# Patient Record
Sex: Male | Born: 1990 | Race: White | Hispanic: No | Marital: Single | Smoking: Current every day smoker
Health system: Southern US, Community
[De-identification: ages and names within clinical notes are randomized; demographics above are authoritative.]

## PROBLEM LIST (undated history)

## (undated) DIAGNOSIS — F1120 Opioid dependence, uncomplicated: Secondary | ICD-10-CM

## (undated) HISTORY — PX: KNEE ARTHROSCOPY: SUR90

## (undated) HISTORY — PX: SHOULDER ARTHROSCOPY: SHX128

---
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
## 2011-01-10 NOTE — Op Note (Signed)
NAMESILUS, LANZO             ACCOUNT NO.:  [id number]   MEDICAL RECORD NO.:  [id number]          PATIENT TYPE:  AMB   LOCATION:  SDS                          FACILITY:  [facility]   PHYSICIAN:  Nadara Mustard, MD     DATE OF BIRTH:  1991/07/05   DATE OF PROCEDURE:  09/25/2005  DATE OF DISCHARGE:                                 OPERATIVE REPORT   PREOPERATIVE DIAGNOSIS:  Osteochondroma, right shoulder.   POSTOPERATIVE DIAGNOSIS:  Osteochondroma, right shoulder.   PROCEDURE:  Excision osteochondroma, right shoulder.   SURGEON.:  Nadara Mustard, M.D.   ANESTHESIA:  General.   ESTIMATED BLOOD LOSS:  100 mL.   ANTIBIOTICS:  1 gram Kefzol.   DRAINS:  None.   COMPLICATIONS:  None.   DISPOSITION:  To PACU in stable condition.   INDICATIONS FOR PROCEDURE:  The patient is a 20 year old gentleman with a  large posterior lateral osteochondroma of the proximal right humerus. The  patient has undergone an MRI scan which was consistent with a benign  osteochondroma. The patient's mother and father were discussed the risks of  chondrosarcoma versus continued observation. The patient and the parents  wished to proceed with surgical intervention at this time. Risks and  benefits were discussed including infection, neurovascular injury, injury to  the axillary nerve, potential for chondrosarcoma in the cartilage cap. The  patient's parents state they understand and wished to proceed at this time.   DESCRIPTION OF PROCEDURE:  The patient was brought to OR room 15 and  underwent general anesthetic. After adequate level of anesthesia was  obtained, the patient was placed in the beach-chair position and his right  upper extremity was prepped using DuraPrep and draped in a sterile field. An  anterior incision was made obliquely from the coracoid along the line of the  cephalic vein. The interval between the coracobrachialis and the deltoid was  developed. There was not a cephalic vein within  this interval. The  coracobrachialis was retracted medially and the deltoid was retracted  laterally. Subperiosteal dissection was then carried out. The axillary nerve  was protected and was visualized. The osteotome was then used to resect the  osteochondroma from the surface of the proximal humerus. The osteochondroma  was delivered and several components. The wound was irrigated with normal  saline. There was no further osteochondroma tissue. The axillary nerve was  visualized and intact. The subcu was closed  using 2-0 Vicryl and the skin was closed using running intracuticular 3-0  Monocryl Steri-Strips. Were applied plus Adaptic, 4x4s and Hypafix tape. The  patient was placed in a sling, extubated, taken to PACU in stable condition.  Plan for 23-hour observation. The osteochondroma was sent to path to  evaluate for possible chondrosarcoma.      Nadara Mustard, MD  Electronically Signed     MVD/MEDQ  D:  09/25/2005  T:  09/25/2005  Job:  [id number]

## 2011-10-28 ENCOUNTER — Encounter (HOSPITAL_BASED_OUTPATIENT_CLINIC_OR_DEPARTMENT_OTHER): Payer: Self-pay | Admitting: *Deleted

## 2011-10-28 ENCOUNTER — Emergency Department (HOSPITAL_BASED_OUTPATIENT_CLINIC_OR_DEPARTMENT_OTHER)
Admission: EM | Admit: 2011-10-28 | Discharge: 2011-10-28 | Disposition: A | Discharge status: Home / Self Care | Payer: Medicaid Other | Attending: Emergency Medicine | Admitting: Emergency Medicine

## 2011-10-28 DIAGNOSIS — F17200 Nicotine dependence, unspecified, uncomplicated: Secondary | ICD-10-CM | POA: Insufficient documentation

## 2011-10-28 DIAGNOSIS — J029 Acute pharyngitis, unspecified: Secondary | ICD-10-CM | POA: Insufficient documentation

## 2011-10-28 DIAGNOSIS — R509 Fever, unspecified: Secondary | ICD-10-CM | POA: Insufficient documentation

## 2011-10-28 DIAGNOSIS — R10819 Abdominal tenderness, unspecified site: Secondary | ICD-10-CM | POA: Insufficient documentation

## 2011-10-28 MED ORDER — DEXAMETHASONE SODIUM PHOSPHATE 10 MG/ML IJ SOLN
10.0000 mg | Freq: Once | INTRAMUSCULAR | Status: AC
  Administered 2011-10-28: 10 mg via INTRAMUSCULAR
  Filled 2011-10-28: qty 1

## 2011-10-28 MED ORDER — PENICILLIN G BENZATHINE 1200000 UNIT/2ML IM SUSP
1.2000 10*6.[IU] | Freq: Once | INTRAMUSCULAR | Status: AC
  Administered 2011-10-28: 1.2 10*6.[IU] via INTRAMUSCULAR
  Filled 2011-10-28: qty 2

## 2011-10-28 NOTE — ED Provider Notes (Signed)
History     CSN: [id number]  Arrival date & time 10/28/11  1356   First MD Initiated Contact with Patient 10/28/11 1417      Chief Complaint  Patient presents with  . Sore Throat    Pt. reports he feels like he has strep throat    (Consider location/radiation/quality/duration/timing/severity/associated sxs/prior treatment) Patient is a 21 y.o. male presenting with pharyngitis. The history is provided by the patient. No language interpreter was used.  Sore Throat This is a new problem. The current episode started yesterday. The problem occurs constantly. The problem has been unchanged. Associated symptoms include a fever and a sore throat. The symptoms are aggravated by swallowing. He has tried acetaminophen and NSAIDs for the symptoms. The treatment provided moderate relief.    No past medical history on file.  Past Surgical History  Procedure Date  . Knee arthroscopy   . Shoulder arthroscopy     No family history on file.  History  Substance Use Topics  . Smoking status: Current Some Day Smoker  . Smokeless tobacco: Not on file  . Alcohol Use: Yes      Review of Systems  Constitutional: Positive for fever.  HENT: Positive for sore throat.   All other systems reviewed and are negative.    Allergies  Review of patient's allergies indicates no known allergies.  Home Medications  No current outpatient prescriptions on file.  BP 120/63  Pulse 97  Temp 97.6 F (36.4 C)  Resp 18  Ht 6\' 2"  (1.88 m)  Wt 190 lb (86.183 kg)  BMI 24.39 kg/m2  SpO2 99%  Physical Exam  Nursing note and vitals reviewed. Constitutional: He is oriented to person, place, and time. He appears well-developed and well-nourished.  HENT:  Right Ear: External ear normal.  Left Ear: External ear normal.  Mouth/Throat: Oropharyngeal exudate and posterior oropharyngeal erythema present. No tonsillar abscesses.  Eyes: Conjunctivae and EOM are normal.  Neck: Neck supple.  Cardiovascular:  Normal rate and regular rhythm.   Pulmonary/Chest: Effort normal and breath sounds normal.  Abdominal: Soft. Bowel sounds are normal. There is tenderness.  Neurological: He is alert and oriented to person, place, and time.  Skin: Skin is warm and dry.    ED Course  Procedures (including critical care time)   Labs Reviewed  RAPID STREP SCREEN   No results found.   1. Pharyngitis       MDM  Pt treated here in the er based on history and physical exam        Teressa Lower, NP 10/28/11 1458

## 2011-10-28 NOTE — ED Notes (Signed)
Pt. Reports he has white spots on his troat.

## 2011-10-28 NOTE — Discharge Instructions (Signed)

## 2011-10-28 NOTE — ED Provider Notes (Signed)
Medical screening examination/treatment/procedure(s) were performed by non-physician practitioner and as supervising physician I was immediately available for consultation/collaboration.   Henny Strauch A. Patrica Duel, MD 10/28/11 1527

## 2012-08-24 ENCOUNTER — Encounter (HOSPITAL_COMMUNITY): Payer: Self-pay | Admitting: *Deleted

## 2012-08-24 ENCOUNTER — Emergency Department (HOSPITAL_COMMUNITY)
Admission: EM | Admit: 2012-08-24 | Discharge: 2012-08-24 | Disposition: A | Discharge status: Home / Self Care | Payer: Medicaid Other | Attending: Emergency Medicine | Admitting: Emergency Medicine

## 2012-08-24 DIAGNOSIS — R6883 Chills (without fever): Secondary | ICD-10-CM | POA: Insufficient documentation

## 2012-08-24 DIAGNOSIS — F17200 Nicotine dependence, unspecified, uncomplicated: Secondary | ICD-10-CM | POA: Insufficient documentation

## 2012-08-24 DIAGNOSIS — N12 Tubulo-interstitial nephritis, not specified as acute or chronic: Secondary | ICD-10-CM | POA: Insufficient documentation

## 2012-08-24 DIAGNOSIS — R059 Cough, unspecified: Secondary | ICD-10-CM | POA: Insufficient documentation

## 2012-08-24 DIAGNOSIS — J029 Acute pharyngitis, unspecified: Secondary | ICD-10-CM | POA: Insufficient documentation

## 2012-08-24 DIAGNOSIS — J3489 Other specified disorders of nose and nasal sinuses: Secondary | ICD-10-CM | POA: Insufficient documentation

## 2012-08-24 DIAGNOSIS — M545 Low back pain, unspecified: Secondary | ICD-10-CM | POA: Insufficient documentation

## 2012-08-24 DIAGNOSIS — R05 Cough: Secondary | ICD-10-CM | POA: Insufficient documentation

## 2012-08-24 LAB — URINALYSIS, ROUTINE W REFLEX MICROSCOPIC
Bilirubin Urine: NEGATIVE
Glucose, UA: NEGATIVE mg/dL
Hgb urine dipstick: NEGATIVE
Ketones, ur: 15 mg/dL — AB
Nitrite: NEGATIVE
Protein, ur: NEGATIVE mg/dL
Specific Gravity, Urine: 1.039 — ABNORMAL HIGH (ref 1.005–1.030)
Urobilinogen, UA: 1 mg/dL (ref 0.0–1.0)
pH: 6.5 (ref 5.0–8.0)

## 2012-08-24 LAB — COMPREHENSIVE METABOLIC PANEL WITH GFR
ALT: 13 U/L (ref 0–53)
AST: 20 U/L (ref 0–37)
Albumin: 4.2 g/dL (ref 3.5–5.2)
Alkaline Phosphatase: 50 U/L (ref 39–117)
Calcium: 9.6 mg/dL (ref 8.4–10.5)
Potassium: 3.7 meq/L (ref 3.5–5.1)
Sodium: 138 meq/L (ref 135–145)
Total Protein: 7.2 g/dL (ref 6.0–8.3)

## 2012-08-24 LAB — GLUCOSE, CAPILLARY

## 2012-08-24 LAB — LIPASE, BLOOD: Lipase: 13 U/L (ref 11–59)

## 2012-08-24 LAB — CBC WITH DIFFERENTIAL/PLATELET
Basophils Absolute: 0 K/uL (ref 0.0–0.1)
Basophils Relative: 0 % (ref 0–1)
Eosinophils Absolute: 0.3 K/uL (ref 0.0–0.7)
Eosinophils Relative: 2 % (ref 0–5)
HCT: 44.4 % (ref 39.0–52.0)
Hemoglobin: 15.4 g/dL (ref 13.0–17.0)
Lymphocytes Relative: 5 % — ABNORMAL LOW (ref 12–46)
Lymphs Abs: 0.6 K/uL — ABNORMAL LOW (ref 0.7–4.0)
MCH: 30.6 pg (ref 26.0–34.0)
MCHC: 34.7 g/dL (ref 30.0–36.0)
MCV: 88.3 fL (ref 78.0–100.0)
Monocytes Absolute: 0.8 10*3/uL (ref 0.1–1.0)
Monocytes Relative: 5 % (ref 3–12)
Neutro Abs: 12.6 10*3/uL — ABNORMAL HIGH (ref 1.7–7.7)
Neutrophils Relative %: 88 % — ABNORMAL HIGH (ref 43–77)
Platelets: 245 K/uL (ref 150–400)
RBC: 5.03 MIL/uL (ref 4.22–5.81)
RDW: 13.3 % (ref 11.5–15.5)
WBC: 14.3 10*3/uL — ABNORMAL HIGH (ref 4.0–10.5)

## 2012-08-24 LAB — COMPREHENSIVE METABOLIC PANEL
BUN: 17 mg/dL (ref 6–23)
CO2: 26 mEq/L (ref 19–32)
Chloride: 100 mEq/L (ref 96–112)
Creatinine, Ser: 0.99 mg/dL (ref 0.50–1.35)
GFR calc Af Amer: >90 mL/min (ref >90)
GFR calc non Af Amer: >90 mL/min (ref >90)
Glucose, Bld: 114 mg/dL — ABNORMAL HIGH (ref 70–99)
Total Bilirubin: 0.5 mg/dL (ref 0.3–1.2)

## 2012-08-24 LAB — URINE MICROSCOPIC-ADD ON

## 2012-08-24 MED ORDER — CIPROFLOXACIN HCL 500 MG PO TABS: 500.0000 mg | ORAL_TABLET | Freq: Two times a day (BID) | ORAL | Status: DC

## 2012-08-24 MED ORDER — ONDANSETRON HCL 4 MG PO TABS: 4.0000 mg | ORAL_TABLET | Freq: Four times a day (QID) | ORAL | Status: DC

## 2012-08-24 MED ORDER — OXYCODONE-ACETAMINOPHEN 5-325 MG PO TABS
2.0000 | ORAL_TABLET | Freq: Once | ORAL | Status: AC
  Administered 2012-08-24: 2 via ORAL
  Filled 2012-08-24: qty 2

## 2012-08-24 MED ORDER — OXYCODONE-ACETAMINOPHEN 5-325 MG PO TABS: ORAL_TABLET | ORAL | Status: DC

## 2012-08-24 MED ORDER — CIPROFLOXACIN HCL 500 MG PO TABS
500.0000 mg | ORAL_TABLET | Freq: Once | ORAL | Status: AC
  Administered 2012-08-24: 500 mg via ORAL
  Filled 2012-08-24: qty 1

## 2012-08-24 MED ORDER — ONDANSETRON 4 MG PO TBDP
4.0000 mg | ORAL_TABLET | Freq: Once | ORAL | Status: AC
  Administered 2012-08-24: 4 mg via ORAL
  Filled 2012-08-24: qty 1

## 2012-08-24 NOTE — ED Provider Notes (Signed)
History     CSN: [id number]  Arrival date & time 08/24/12  1424   First MD Initiated Contact with Patient 08/24/12 2002      Chief Complaint  Patient presents with  . Emesis    (Consider location/radiation/quality/duration/timing/severity/associated sxs/prior treatment) HPI  James Crane is a 21 y.o. male complaining of several episodes of nonbloody nonbilious emesis starting this afternoon and low back pain. Patient has positive sick contacts her children he is contact with our vomiting as well. Patient denies any abdominal pain, chest pain, diarrhea, dysuria, frequency, urethral discharge. Patient endorses chills, cough, runny nose, mild sore throat. He has not had a flu shot this year. Patient does not have regular STD testing. He is tolerating by mouth at this time.   History reviewed. No pertinent past medical history.  Past Surgical History  Procedure Date  . Knee arthroscopy   . Shoulder arthroscopy     No family history on file.  History  Substance Use Topics  . Smoking status: Current Some Day Smoker  . Smokeless tobacco: Not on file  . Alcohol Use: Yes      Review of Systems  Constitutional: Negative for fever.  Respiratory: Negative for shortness of breath.   Cardiovascular: Negative for chest pain.  Gastrointestinal: Positive for nausea and vomiting. Negative for abdominal pain and diarrhea.  Musculoskeletal: Positive for back pain.  All other systems reviewed and are negative.    Allergies  Other  Home Medications  No current outpatient prescriptions on file.  BP 111/70  Pulse 106  Temp 98.3 F (36.8 C) (Oral)  Resp 12  SpO2 100%  Physical Exam  Nursing note and vitals reviewed. Constitutional: He is oriented to person, place, and time. He appears well-developed and well-nourished. No distress.  HENT:  Head: Normocephalic.  Eyes: Conjunctivae normal and EOM are normal.  Cardiovascular: Normal rate, regular rhythm and intact distal  pulses.   Pulmonary/Chest: Effort normal and breath sounds normal. No stridor.  Abdominal: Soft. Bowel sounds are normal. He exhibits no distension and no mass. There is no tenderness. There is no rebound and no guarding.  Genitourinary:       Mild to Moderate CVA tenderness: right greater than left  Musculoskeletal: Normal range of motion.  Neurological: He is alert and oriented to person, place, and time.  Psychiatric: He has a normal mood and affect.    ED Course  Procedures (including critical care time)  Labs Reviewed  GLUCOSE, CAPILLARY - Abnormal; Notable for the following:    Glucose-Capillary 108 (*)     All other components within normal limits  CBC WITH DIFFERENTIAL - Abnormal; Notable for the following:    WBC 14.3 (*)     Neutrophils Relative 88 (*)     Neutro Abs 12.6 (*)     Lymphocytes Relative 5 (*)     Lymphs Abs 0.6 (*)     All other components within normal limits  COMPREHENSIVE METABOLIC PANEL - Abnormal; Notable for the following:    Glucose, Bld 114 (*)     All other components within normal limits  URINALYSIS, ROUTINE W REFLEX MICROSCOPIC - Abnormal; Notable for the following:    Specific Gravity, Urine 1.039 (*)     Ketones, ur 15 (*)     Leukocytes, UA SMALL (*)     All other components within normal limits  LIPASE, BLOOD  URINE MICROSCOPIC-ADD ON  GC/CHLAMYDIA PROBE AMP  URINE CULTURE   No results found.  1. Pyelonephritis       MDM  Patient with CVA tenderness, back pain, nausea vomiting. Urinalysis shows leukocytes 11-20 white blood cells. Blood work has leukocytosis of 14.3. I will treat him for a pyelonephritis with ciprofloxacin 500 twice a day for 7 days.  I'm also adding on gonorrhea and chlamydia to his urinalysis, addition to a culture. I asked the patient to follow with his primary care Dr. or return to the emergency room for any worsening of symptoms and also for a test of cure with a repeat urinalysis after the he finishes the  antibiotics.   Pt verbalized understanding and agrees with care plan. Outpatient follow-up and return precautions given.    New Prescriptions   CIPROFLOXACIN (CIPRO) 500 MG TABLET    Take 1 tablet (500 mg total) by mouth every 12 (twelve) hours.   ONDANSETRON (ZOFRAN) 4 MG TABLET    Take 1 tablet (4 mg total) by mouth every 6 (six) hours.   OXYCODONE-ACETAMINOPHEN (PERCOCET/ROXICET) 5-325 MG PER TABLET    1 to 2 tabs PO q6hrs  PRN for pain          Wynetta Emery, PA-C 08/24/12 2100

## 2012-08-24 NOTE — ED Notes (Signed)
Pt able to keep fluids down; pt denies nausea currently.

## 2012-08-24 NOTE — ED Notes (Signed)
Pt given d/c teaching and prescriptions. Pt instructed not to drive after being given pain medication. Pt ambulatory leaving ED. Pt has no further questions upon d/c teaching. Pt does not appear to be in any acute distress upon d/c.

## 2012-08-24 NOTE — ED Provider Notes (Signed)
Medical screening examination/treatment/procedure(s) were performed by non-physician practitioner and as supervising physician I was immediately available for consultation/collaboration.  Olivia Mackie, MD 08/24/12 [phone]

## 2012-08-24 NOTE — ED Notes (Signed)
Nicole Pisciotta, PA at bedside 

## 2012-08-24 NOTE — ED Notes (Signed)
Pt is here with vomiting since yesterday.  No diarrhea, but reports constipation.  No abdominal pain.  Pt reports lower back pain.  No pain with urination.  Pt appears pale.

## 2012-08-24 NOTE — ED Notes (Signed)
Pt updated on POC.  Pt very upset at the amount of time that he has been here.  Pt asleep on the couch in triage.

## 2012-08-24 NOTE — ED Notes (Signed)
The pt is lying  In the floor in the waiting room

## 2012-08-24 NOTE — ED Notes (Signed)
The pt advised fo the wait time. unhappy

## 2012-08-26 LAB — URINE CULTURE: Colony Count: 9000

## 2012-08-27 LAB — GC/CHLAMYDIA PROBE AMP: GC Probe RNA: NEGATIVE

## 2012-09-08 ENCOUNTER — Telehealth (HOSPITAL_COMMUNITY): Payer: Self-pay | Admitting: Emergency Medicine

## 2012-09-08 NOTE — ED Notes (Signed)
Patient notified of positive Chlamydia result after ID verified x three. RX Zithromax one gram PO x one dose, prescribed by Linwood Dibbles MD, called to [facility] [phone].

## 2012-09-18 ENCOUNTER — Emergency Department (HOSPITAL_COMMUNITY)
Admission: EM | Admit: 2012-09-18 | Discharge: 2012-09-18 | Disposition: A | Discharge status: Home / Self Care | Payer: Medicaid Other | Attending: Emergency Medicine | Admitting: Emergency Medicine

## 2012-09-18 ENCOUNTER — Encounter (HOSPITAL_COMMUNITY): Payer: Self-pay | Admitting: Emergency Medicine

## 2012-09-18 DIAGNOSIS — M7989 Other specified soft tissue disorders: Secondary | ICD-10-CM | POA: Insufficient documentation

## 2012-09-18 DIAGNOSIS — F17200 Nicotine dependence, unspecified, uncomplicated: Secondary | ICD-10-CM | POA: Insufficient documentation

## 2012-09-18 DIAGNOSIS — L03119 Cellulitis of unspecified part of limb: Secondary | ICD-10-CM | POA: Insufficient documentation

## 2012-09-18 DIAGNOSIS — L02519 Cutaneous abscess of unspecified hand: Secondary | ICD-10-CM | POA: Insufficient documentation

## 2012-09-18 DIAGNOSIS — L0390 Cellulitis, unspecified: Secondary | ICD-10-CM

## 2012-09-18 DIAGNOSIS — L299 Pruritus, unspecified: Secondary | ICD-10-CM | POA: Insufficient documentation

## 2012-09-18 MED ORDER — DIPHENHYDRAMINE HCL 50 MG/ML IJ SOLN
25.0000 mg | Freq: Once | INTRAMUSCULAR | Status: AC
  Administered 2012-09-18: 25 mg via INTRAMUSCULAR
  Filled 2012-09-18: qty 1

## 2012-09-18 MED ORDER — METHYLPREDNISOLONE SODIUM SUCC 125 MG IJ SOLR
125.0000 mg | Freq: Once | INTRAMUSCULAR | Status: AC
  Administered 2012-09-18: 125 mg via INTRAMUSCULAR
  Filled 2012-09-18: qty 2

## 2012-09-18 MED ORDER — SULFAMETHOXAZOLE-TMP DS 800-160 MG PO TABS
1.0000 | ORAL_TABLET | Freq: Once | ORAL | Status: AC
  Administered 2012-09-18: 1 via ORAL
  Filled 2012-09-18: qty 1

## 2012-09-18 MED ORDER — PREDNISONE 20 MG PO TABS: 40.0000 mg | ORAL_TABLET | Freq: Every day | ORAL | Status: DC

## 2012-09-18 MED ORDER — DIPHENHYDRAMINE HCL 50 MG/ML IJ SOLN: 25.0000 mg | Freq: Once | INTRAMUSCULAR | Status: DC

## 2012-09-18 MED ORDER — SULFAMETHOXAZOLE-TRIMETHOPRIM 800-160 MG PO TABS: 1.0000 | ORAL_TABLET | Freq: Two times a day (BID) | ORAL | Status: DC

## 2012-09-18 NOTE — ED Provider Notes (Signed)
History     CSN: [id number]  Arrival date & time 09/18/12  0100   First MD Initiated Contact with Patient 09/18/12 0131      Chief Complaint  Patient presents with  . Hand Problem    (Consider location/radiation/quality/duration/timing/severity/associated sxs/prior treatment) HPI  Pt to the ED with complaints of severe left hand itching, swelling and redness. The symptoms started a few days ago and have been progressively getting worse. He says that the reason he came is because it itches so badly he couldn't sleep. He has been using benadryl cream which helps for a short time but then the itching soon returns. He denies having any fevers, injury, bug bites to the hand. Admits that this has never happened before. Otherwise feels fine. No respiratory symptoms.  nad vss  History reviewed. No pertinent past medical history.  Past Surgical History  Procedure Date  . Knee arthroscopy   . Shoulder arthroscopy     History reviewed. No pertinent family history.  History  Substance Use Topics  . Smoking status: Current Some Day Smoker  . Smokeless tobacco: Not on file  . Alcohol Use: Yes      Review of Systems  Review of Systems  Gen: no weight loss, fevers, chills, night sweats  Eyes: no discharge or drainage, no occular pain or visual changes  Nose: no epistaxis or rhinorrhea  Mouth: no dental pain, no sore throat  Neck: no neck pain  Lungs:No wheezing, coughing or hemoptysis CV: no chest pain, palpitations, dependent edema or orthopnea  Abd: no abdominal pain, nausea, vomiting  GU: no dysuria or gross hematuria  MSK:  Swelling to left hand. No hand pain Neuro: no headache, no focal neurologic deficits  Skin: no abnormalities Psyche: negative.   Allergies  Other  Home Medications   Current Outpatient Rx  Name  Route  Sig  Dispense  Refill  . CIPROFLOXACIN HCL 500 MG PO TABS   Oral   Take 1 tablet (500 mg total) by mouth every 12 (twelve) hours.   20  tablet   0   . ONDANSETRON HCL 4 MG PO TABS   Oral   Take 1 tablet (4 mg total) by mouth every 6 (six) hours.   12 tablet   0   . OXYCODONE-ACETAMINOPHEN 5-325 MG PO TABS      1 to 2 tabs PO q6hrs  PRN for pain   15 tablet   0     BP 125/68  Pulse 75  Temp 97.9 F (36.6 C) (Oral)  Resp 16  SpO2 98%  Physical Exam  Nursing note and vitals reviewed. Constitutional: He appears well-developed and well-nourished. No distress.  HENT:  Head: Normocephalic and atraumatic.  Eyes: Pupils are equal, round, and reactive to light.  Neck: Normal range of motion. Neck supple.  Cardiovascular: Normal rate and regular rhythm.   Pulmonary/Chest: Effort normal.  Abdominal: Soft.  Musculoskeletal:       Posterior portion of Left hand is swollen, erythematous and indurated. No noticeable injuries, pain, decreased rang of motion. Pt vigorously scratching hand even during exam.  Neurological: He is alert.  Skin: Skin is warm and dry.    ED Course  Procedures (including critical care time)  Labs Reviewed - No data to display No results found.   No diagnosis found. Dx: Hand swelling   MDM  Pt otherwise healthy. Swelling, erythema and induration consistent with infection but he is having severe itchiness to the hand and no pain  or fevers with no site of infection. Will treat for both.   Given IM benadryl and Solu-medrol in ED as well as 1 PO Bactrim.  Rx:  1. Bactrim 2. Benadryl 3. Prednisone   Pt given strict return to ED precautions and advised that this could be allergic or infectious.  Pt has been advised of the symptoms that warrant their return to the ED. Patient has voiced understanding and has agreed to follow-up with the PCP or specialist.        Dorthula Matas, PA 09/18/12 [phone]

## 2012-09-18 NOTE — ED Notes (Addendum)
Patient complaining of left hand swelling and itching that started two days ago.  Patient able to move all digits without difficulty.  Denies injury.

## 2012-09-18 NOTE — ED Provider Notes (Signed)
Medical screening examination/treatment/procedure(s) were performed by non-physician practitioner and as supervising physician I was immediately available for consultation/collaboration.  Sunnie Nielsen, MD 09/18/12 [phone]

## 2012-11-26 ENCOUNTER — Emergency Department (HOSPITAL_COMMUNITY)
Admission: EM | Admit: 2012-11-26 | Discharge: 2012-11-26 | Disposition: A | Discharge status: Home / Self Care | Payer: Medicaid Other | Attending: Emergency Medicine | Admitting: Emergency Medicine

## 2012-11-26 ENCOUNTER — Encounter (HOSPITAL_COMMUNITY): Payer: Self-pay | Admitting: *Deleted

## 2012-11-26 DIAGNOSIS — F1990 Other psychoactive substance use, unspecified, uncomplicated: Secondary | ICD-10-CM

## 2012-11-26 DIAGNOSIS — F17200 Nicotine dependence, unspecified, uncomplicated: Secondary | ICD-10-CM | POA: Insufficient documentation

## 2012-11-26 DIAGNOSIS — T401X1A Poisoning by heroin, accidental (unintentional), initial encounter: Secondary | ICD-10-CM | POA: Insufficient documentation

## 2012-11-26 DIAGNOSIS — Y9389 Activity, other specified: Secondary | ICD-10-CM | POA: Insufficient documentation

## 2012-11-26 DIAGNOSIS — T401X4A Poisoning by heroin, undetermined, initial encounter: Secondary | ICD-10-CM | POA: Insufficient documentation

## 2012-11-26 DIAGNOSIS — Y9289 Other specified places as the place of occurrence of the external cause: Secondary | ICD-10-CM | POA: Insufficient documentation

## 2012-11-26 NOTE — Discharge Instructions (Signed)
The injection of the carbon material will not cause severe adverse reactions.  It is important for you to stop using iv drugs.  You are always at risk for death from severe infections, heart damage, air bubbles entering your blood stream, or overdose.  Look into area treatment facilities when you are ready.   Drug Abuse, Frequently Asked Questions Drug addiction is a complex brain disease. It is characterized by compulsive, at times uncontrollable, drug craving, seeking, and use that persists even in the face of extremely negative results. Drug seeking becomes compulsive, in large part as a result of the effects of prolonged drug use on brain functioning and, thus, on behavior. For many people, drug addiction becomes chronic, with relapses possible even after long periods of being off the drug. HOW QUICKLY CAN I BECOME ADDICTED TO A DRUG? There is no easy answer to this. If and how quickly you might become addicted to a drug depends on many factors including the biology of your body. All drugs are potentially harmful and may have life-threatening consequences associated with their use. There are also vast differences among individuals in sensitivity to various drugs. While one person may use a drug many times and suffer no ill effects, another person may be particularly vulnerable and overdose or developing a craving with the first use. There is no way of knowing in advance how someone may react. HOW DO I KNOW IF SOMEONE IS ADDICTED TO DRUGS? If a person is compulsively seeking and using a drug despite negative consequences (such as loss of job, debt, physical problems brought on by drug abuse, or family problems) then he or she is probably addicted. Those who screen for drug problems, such as physicians, have developed the CAGE questionnaire. These four simple questions can help detect substance abuse problems:  Have you ever felt you ought to Cut down on your drinking/drug use?  Have people ever  Annoyed you by criticizing your drinking/drug use?  Have you ever felt bad or Guilty about your drinking/drug use?  Have you ever had a drink or taken a drug first thing in the morning to steady your nerves or get rid of a hangover (Eye-opener)? WHAT ARE THE PHYSICAL SIGNS OF ABUSE OR ADDICTION? The physical signs of abuse or addiction can vary depending on the person and the drug being abused. For example, someone who abuses marijuana may have a chronic cough or worsening of asthmatic conditions. THC, the chemical in marijuana responsible for producing its effects, is associated with weakening the immune system which makes the user more vulnerable to infections, such as pneumonia. Each drug has short-term and long-term physical effects. Stimulants like cocaine increase heart rate and blood pressure, whereas opioids like heroin may slow the heart rate and reduce breathing (respiration).  ARE THERE EFFECTIVE TREATMENTS FOR DRUG ADDICTION? Drug addiction can be effectively treated with behavioral-based therapies and, for addiction to some drugs such as heroin or nicotine, medications may be used. Treatment may vary for each person depending on the type of drug(s) being used and multiple courses of treatment may be needed to achieve success. Research has revealed 13 basic principles that underlie effective drug addiction treatment. These are discussed in NIDA's Principles of Drug Addiction Treatment: A Research-Based Guide. WHERE CAN I FIND INFORMATION ABOUT DRUG TREATMENT PROGRAMS?  For referrals to treatment programs, visit the Substance Abuse and Mental Health Services Administration online at http://findtreatment.http://gonzalez-rivas.net/.  NIDA publishes an expanding series of treatment manuals, the "clinical toolbox," that gives drug treatment  providers research-based information for creating effective treatment programs. WHAT IS DETOXIFICATION, OR "DETOX"? Detoxification is the process of allowing the body to  rid itself of a drug while managing the symptoms of withdrawal. It is often the first step in a drug treatment program and should be followed by treatment with a behavioral-based therapy and/or a medication, if available. Detox alone with no follow-up is not treatment.  WHAT IS WITHDRAWAL? HOW LONG DOES IT LAST? Withdrawal is the variety of symptoms that occur after use of some addictive drugs is reduced or stopped. Length of withdrawal and symptoms vary with the type of drug. For example, physical symptoms of heroin withdrawal may include restlessness, muscle and bone pain, insomnia, diarrhea, vomiting, and cold flashes. These physical symptoms may last for several days, but the general depression, or dysphoria (opposite of euphoria), that often accompanies heroin withdrawal, may last for weeks. In many cases withdrawal can be easily treated with medications to ease the symptoms. But treating withdrawal is not the same as treating addiction.  WHAT ARE THE COSTS OF DRUG ABUSE TO SOCIETY? Beyond the raw numbers are other costs to society:  Spread of infectious diseases such as HIV/AIDS and hepatitis C either through sharing of drug paraphernalia or unprotected sex.  Deaths due to overdose or other complications from drug use.  Effects on unborn children of pregnant drug users.  Other effects such as crime and homelessness. IF A PREGNANT WOMAN ABUSES DRUGS, DOES IT AFFECT THE FETUS?  Many substances including alcohol, nicotine, and drugs of abuse can have negative effects on the developing fetus because they are transferred to the fetus across the placenta. For example, nicotine has been connected with premature birth and low birth weight, as has the use of cocaine. Scientific studies have shown that babies born to marijuana users were shorter, weighed less, and had smaller head sizes than those born to mothers who did not use the drug. Smaller babies are more likely to develop health  problems.  Whether a baby's health problems, if caused by a drug, will continue as the child grows, is not always known. Research does show that children born to mothers who used marijuana regularly during pregnancy may have trouble concentrating, when older. Our research continues to produce insights on the negative effects of drug use on the fetus. Document Released: 08/14/2003 Document Revised: 11/03/2011 Document Reviewed: 11/10/2008 [facility] Patient Information 2013 [place], [place].      If you were a [facility] patient or do not have insurance, here are some clinics in our community that may be able to provide care for free or on sliding payment scales.  [facility], [address], [place], [phone]; Monday to Friday, 9 a.m. - 7 p.m.; Saturday 9 a.m. to 1 p.m.   [facility], [address], [place]; [phone]; or [address]  [place]; [phone].    [facility] [place], [place]. [address], [place]; [phone]; Monday to Wednesday, 8:30 a.m. - 5 p.m.; Thursday, 8:30 a.m. - 8 p.m.   [facility], [address], [place]; [phone]; Monday to Friday, 8 a.m. - 4:30 p.m.    [facility], [place] [address] [place], [phone]; first and third Saturday of the  month, 9:30 a.m. - 12:30 p.m.   [facility], [address] [place], [phone]; second Saturday of the month, 9 a.m. -noon.   RESOURCE GUIDE  Dental Problems  Patients with Medicaid: [facility] [phone] [address]                                                                   [phone] [employer] [address] Phone:  [phone]                                                                             Phone:  [phone]  If unable to pay or uninsured, contact:  [facility] or [facility]. to become  qualified for the adult dental clinic.  Chronic Pain Problems Contact [facility]  [phone] Patients need to be referred by their primary care doctor.  Insufficient Money for Medicine Contact [employer]:  call "211" or [facility] [phone].  No Primary Care Doctor Call [facility]  [phone] Other agencies that provide inexpensive medical care    [facility]  [phone]    [facility]  [phone]    [facility]  [phone]    [facility]  [phone]    [facility]  [phone]  Psychological Services [facility]  [phone] [facility]  [phone] [facility]   [phone] (emergency services [phone])  Abuse/Neglect [facility] Child Abuse Hotline [phone] [facility] Child Abuse Hotline [phone] (After Hours)  Emergency Shelter [facility] [phone]  Maternity Homes Room at the [place] [facility] [phone] [facility] [phone]  MRSA Hotline #:   (508) 085-8001  [facility] Resources  [facility]     [employer]                          [facility]. [address] [place]                        [address]          [address]  [place]                                                Cristobal Goldmann Phone:  [phone]  Phone:  [phone]                   Phone:  [phone]  [facility] Mental Health Phone:  [phone]  [facility] Child Abuse Hotline [phone] (After Hours)   Community Resources: *IF YOU ARE IN IMMEDIATE DANGER CALL 911!  Abuse/Neglect:  Family Services Crisis Hotline [facility]): 458-065-6999 [facility] Against [facility]): [phone]  After hours, holidays and weekends: 2128824302 National Domestic  Violence Hotline: (785)168-5982  Mental Health: [facility] Mental Health: Drucie Ip: [phone]  Health Clinics:  [facility]): [phone] Monday - Friday 8 AM - 9 PM, Saturday and Sunday 10 AM - 9 PM   [facility]  [address]: [phone] Monday- Friday 8:30 AM - 5:30 PM, Sat 9 AM - 1 PM  24 HR [facility] [place]: [phone] [facility]: [phone] [facility] [address]: [phone]  24 HR [facility]: [address] [phone]

## 2012-11-26 NOTE — ED Notes (Signed)
Pt c/o feeling lightheaded. Denies any other complaints. Injection site at right AC, no redness or swelling noted to area.

## 2012-11-26 NOTE — ED Notes (Signed)
Pt states he was shooting IV heroin 20 min PTA, states there was some "black soot" in the needle and is worried "he is going to die".  Denies n/v, pt appears anxious.

## 2012-11-27 NOTE — ED Provider Notes (Signed)
History     CSN: [id number]  Arrival date & time 11/26/12  2056   First MD Initiated Contact with Patient 11/26/12 2302      Chief Complaint  Patient presents with  . Drug Problem    (Consider location/radiation/quality/duration/timing/severity/associated sxs/prior treatment) HPI 22 year old male presents to emergency department complaining of injection of black soot-like material by accident.  Patient reports he was cooking his heroin in a spoon.  He rested the spoon on a piece of furniture, then later dropped the cotton from a Q-tip that he was using as a filter agent into the black soot residue.  Patient reports he picked up the cotton, and withdrew the cooked down heroin mixture along with some black soot.  Patient is concerned that he is going to die, due to the black soot that he injected along with the heroin into his vein.  Patient denies any shortness of breath, no pain at the site of injection.  Patient reports he felt "a little weird" after injecting the heroin, different than his usual, high.  No other complaints at this time.  Patient estimates he injected the heroin mixed with the soot about 2 hours ago.  History reviewed. No pertinent past medical history.  Past Surgical History  Procedure Laterality Date  . Knee arthroscopy    . Shoulder arthroscopy      History reviewed. No pertinent family history.  History  Substance Use Topics  . Smoking status: Current Some Day Smoker  . Smokeless tobacco: Not on file  . Alcohol Use: Yes      Review of Systems  All other systems reviewed and are negative.    Allergies  Other  Home Medications  No current outpatient prescriptions on file.  BP 113/72  Pulse 66  Temp(Src) 97.7 F (36.5 C) (Oral)  Resp 16  SpO2 95%  Physical Exam  Nursing note and vitals reviewed. Constitutional: He is oriented to person, place, and time. He appears well-developed and well-nourished.  HENT:  Head: Normocephalic and  atraumatic.  Nose: Nose normal.  Mouth/Throat: Oropharynx is clear and moist.  Eyes: Conjunctivae and EOM are normal. Pupils are equal, round, and reactive to light.  Neck: Normal range of motion. Neck supple. No JVD present. No tracheal deviation present. No thyromegaly present.  Cardiovascular: Normal rate, regular rhythm, normal heart sounds and intact distal pulses.  Exam reveals no gallop and no friction rub.   No murmur heard. Pulmonary/Chest: Effort normal and breath sounds normal. No stridor. No respiratory distress. He has no wheezes. He has no rales. He exhibits no tenderness.  Abdominal: Soft. Bowel sounds are normal. He exhibits no distension and no mass. There is no tenderness. There is no rebound and no guarding.  Musculoskeletal: Normal range of motion. He exhibits no edema and no tenderness.  Lymphadenopathy:    He has no cervical adenopathy.  Neurological: He is alert and oriented to person, place, and time. No cranial nerve deficit. He exhibits normal muscle tone. Coordination normal.  Skin: Skin is warm and dry. No rash noted. No erythema. No pallor.  Psychiatric: He has a normal mood and affect. His behavior is normal. Judgment and thought content normal.    ED Course  Procedures (including critical care time)  Labs Reviewed - No data to display No results found.   1. IVDU (intravenous drug user)       MDM  22 year old male with injection of heroin along with some carbonaceous soot.  Patient reassured that the soot  and would not kill him outright, but that injection of heroin would kill him in other ways.  He was advised to stop using IV drugs.  Given resources in the community.        Olivia Mackie, MD 11/27/12 [phone]

## 2013-01-22 ENCOUNTER — Emergency Department (HOSPITAL_COMMUNITY): Payer: Medicaid Other

## 2013-01-22 ENCOUNTER — Emergency Department (HOSPITAL_COMMUNITY)
Admission: EM | Admit: 2013-01-22 | Discharge: 2013-01-22 | Disposition: A | Discharge status: Home / Self Care | Payer: Medicaid Other | Attending: Emergency Medicine | Admitting: Emergency Medicine

## 2013-01-22 ENCOUNTER — Encounter (HOSPITAL_COMMUNITY): Payer: Self-pay | Admitting: *Deleted

## 2013-01-22 DIAGNOSIS — R000 Tachycardia, unspecified: Secondary | ICD-10-CM | POA: Insufficient documentation

## 2013-01-22 DIAGNOSIS — F17200 Nicotine dependence, unspecified, uncomplicated: Secondary | ICD-10-CM | POA: Insufficient documentation

## 2013-01-22 DIAGNOSIS — M25562 Pain in left knee: Secondary | ICD-10-CM

## 2013-01-22 DIAGNOSIS — M25469 Effusion, unspecified knee: Secondary | ICD-10-CM | POA: Insufficient documentation

## 2013-01-22 DIAGNOSIS — M25569 Pain in unspecified knee: Secondary | ICD-10-CM | POA: Insufficient documentation

## 2013-01-22 DIAGNOSIS — F1210 Cannabis abuse, uncomplicated: Secondary | ICD-10-CM | POA: Insufficient documentation

## 2013-01-22 DIAGNOSIS — F1110 Opioid abuse, uncomplicated: Secondary | ICD-10-CM | POA: Insufficient documentation

## 2013-01-22 LAB — CBC WITH DIFFERENTIAL/PLATELET
Eosinophils Absolute: 0.2 10*3/uL (ref 0.0–0.7)
Eosinophils Relative: 2 % (ref 0–5)
HCT: 40.3 % (ref 39.0–52.0)
Hemoglobin: 14.1 g/dL (ref 13.0–17.0)
Lymphs Abs: 3.4 10*3/uL (ref 0.7–4.0)
MCH: 30 pg (ref 26.0–34.0)
MCV: 85.7 fL (ref 78.0–100.0)
Monocytes Absolute: 0.8 10*3/uL (ref 0.1–1.0)
Monocytes Relative: 8 % (ref 3–12)
Neutrophils Relative %: 57 % (ref 43–77)
RBC: 4.7 MIL/uL (ref 4.22–5.81)

## 2013-01-22 LAB — COMPREHENSIVE METABOLIC PANEL
BUN: 20 mg/dL (ref 6–23)
GFR calc non Af Amer: >90 mL/min (ref >90)
Glucose, Bld: 105 mg/dL — ABNORMAL HIGH (ref 70–99)
Potassium: 4.1 mEq/L (ref 3.5–5.1)
Total Protein: 7.8 g/dL (ref 6.0–8.3)

## 2013-01-22 LAB — POCT I-STAT TROPONIN I

## 2013-01-22 MED ORDER — SODIUM CHLORIDE 0.9 % IV BOLUS (SEPSIS)
1000.0000 mL | Freq: Once | INTRAVENOUS | Status: AC
  Administered 2013-01-22: 1000 mL via INTRAVENOUS

## 2013-01-22 NOTE — ED Notes (Signed)
The pt has chronic knee pain.  He is c/o lt knee pain and he was walking and the pain started after the knee popped

## 2013-01-22 NOTE — ED Provider Notes (Signed)
I have supervised the resident on the management of this patient and agree with the note above. I personally interviewed and examined the patient and my addendum is below.   James Crane is a 22 y.o. male here with L knee pain. His L knee gave out today. He has loose cartilage on R knee and he said that this is similar to that. He also used so roxi prior to arrival. Tachycardic in the ED, likely from roxi use. No chest pain and istat trop neg. Tachycardia improved with IVF. Xray showed no fracture. Suspect strain vs meniscus injury. He was placed on knee immobilizer and crutches and told to f/u with ortho.    Richardean Canal, MD 01/22/13 [phone]

## 2013-01-22 NOTE — Progress Notes (Signed)
Orthopedic Tech Progress Note Patient Details:  James Crane [birth year] [id number]  Ortho Devices Type of Ortho Device: Crutches;Knee Sleeve Ortho Device/Splint Location: LLE Ortho Device/Splint Interventions: Ordered;Application   Jennye Moccasin 01/22/2013, 7:07 PM

## 2013-01-22 NOTE — ED Provider Notes (Signed)
History     CSN: [id number]  Arrival date & time 01/22/13  1619   First MD Initiated Contact with Patient 01/22/13 1652      Chief Complaint  Patient presents with  . Knee Pain    (Consider location/radiation/quality/duration/timing/severity/associated sxs/prior treatment) HPI Comments: 22 y.o. male who presents to the Er after he states he was walking, and then felt his left knee, "pop". He is having pain in his left knee. He states no fevers or chills. He states smoked marijana and also snorted a roxi prior to arrival for the pain.   Patient is a 22 y.o. male presenting with general illness. The history is provided by the patient.  Illness Location:  Left knee Severity:  Mild Onset quality:  Sudden Timing:  Constant Progression:  Unchanged Chronicity:  New Associated symptoms: no abdominal pain, no chest pain, no diarrhea, no fever, no headaches, no nausea, no rash, no shortness of breath and no wheezing     History reviewed. No pertinent past medical history.  Past Surgical History  Procedure Laterality Date  . Knee arthroscopy    . Shoulder arthroscopy      No family history on file.  History  Substance Use Topics  . Smoking status: Current Some Day Smoker  . Smokeless tobacco: Not on file  . Alcohol Use: Yes      Review of Systems  Constitutional: Negative for fever, chills and activity change.  HENT: Negative for drooling, mouth sores and neck pain.   Eyes: Negative for pain and discharge.  Respiratory: Negative for chest tightness, shortness of breath and wheezing.   Cardiovascular: Negative for chest pain.  Gastrointestinal: Negative for nausea, abdominal pain, diarrhea and constipation.  Genitourinary: Negative for dysuria, flank pain and difficulty urinating.  Musculoskeletal: Negative for back pain and arthralgias.  Skin: Negative for color change, pallor and rash.  Neurological: Negative for dizziness, weakness, light-headedness and headaches.   Psychiatric/Behavioral: Negative for behavioral problems, confusion and agitation.    Allergies  Other  Home Medications   Current Outpatient Rx  Name  Route  Sig  Dispense  Refill  . hydrocortisone cream 1 %   Topical   Apply 1 application topically 2 (two) times daily as needed (for rash/itching).           BP 118/60  Pulse 150  Temp(Src) 98.4 F (36.9 C)  Resp 20  SpO2 96%  Physical Exam  Constitutional: He is oriented to person, place, and time. He appears well-developed. No distress.  HENT:  Head: Normocephalic.  Eyes: Pupils are equal, round, and reactive to light. Right eye exhibits no discharge. Left eye exhibits no discharge.  Neck: Neck supple. No tracheal deviation present.  Cardiovascular: Regular rhythm and intact distal pulses.   tachycardic  Pulmonary/Chest: Effort normal. No stridor. No respiratory distress. He has no wheezes.  Abdominal: Soft. He exhibits no distension. There is no tenderness. There is no rebound.  Musculoskeletal: Normal range of motion. He exhibits no tenderness.  Left knee with full extension and swelling. Minimal swelling in left knee. Can ambulate and put pressure on left knee. +2 DP bilaterally.   Neurological: He is alert and oriented to person, place, and time. No cranial nerve deficit.  Skin: Skin is warm. No rash noted. He is not diaphoretic. No erythema.    ED Course  Procedures (including critical care time)  Labs Reviewed  COMPREHENSIVE METABOLIC PANEL - Abnormal; Notable for the following:    Glucose, Bld 105 (*)  Total Bilirubin 0.2 (*)    All other components within normal limits  CBC WITH DIFFERENTIAL  POCT I-STAT TROPONIN I   Dg Knee 2 Views Left  01/22/2013   *RADIOLOGY REPORT*  Clinical Data: Left knee pain  LEFT KNEE - 1-2 VIEW  Comparison: None.  Findings: No fracture or dislocation is seen.  The joint spaces are preserved.  Moderate suprapatellar knee joint effusion.  IMPRESSION: No fracture or  dislocation is seen.  Moderate suprapatellar knee joint effusion.   Original Report Authenticated By: Charline Bills, M.D.    ECG shows sinus tachycardia, HR, 158, no pathologic ST wave changes, T waves in V3 are peaked.   MDM  No traumatic fall or trauma to his left knee. Pt is concerned that he has fx.   Pt's tachycardia most likely secondary to snorting roxi and also using marijauna prior to arrival. Pt has hx of IV drug use and general drug abuse. Pt denies fevers, but due to elevated HR of 150s in triage, will need to get labs and give fluids, will get lactic acid to eval for signs of fever. Unable to appreciate a murmur on his cardiac exam, doubt he has pericarditis / endocarditis as etiology of symptoms.   On my exam, pt's HR is now in the low 120s, suspect with fluids, his HR will improve. Will get x-rays of left knee. Pt states, "I'm not here in her ER for my drug use, I'm here to get my left knee checked out", and he states, "i don't plan on stop using drugs", denies SI and HI.   Pt's repeat Hr are in the 70s. He is doing well. X-ray does not show fx. iStat trop is neg. Pt's WBC is normal. He is not febrile. Pt does not have any signs of sepsis.  Pt's  Suspect he might have strain vs meniscus injury. Have educated pt on his drug abuse and why it will lead to poor outcomes, and pt states understanding and states he will "think about stopping". Have placed pt's knee in brace and given crutches and told to f/u with orthopedic surgery if symptoms don't improve within a week.   1. Left knee pain   2. L knee pain 3. Tachycardia       Bernadene Person, MD 01/22/13 [phone]

## 2013-01-22 NOTE — ED Notes (Signed)
The pt has tachycardia he admits to smoking pot just after the knee pain started

## 2013-03-02 ENCOUNTER — Other Ambulatory Visit: Payer: Self-pay | Admitting: Family Medicine

## 2013-03-02 DIAGNOSIS — M25562 Pain in left knee: Secondary | ICD-10-CM

## 2013-03-03 ENCOUNTER — Ambulatory Visit
Admission: RE | Admit: 2013-03-03 | Discharge: 2013-03-03 | Disposition: A | Discharge status: Home / Self Care | Payer: Medicaid Other | Source: Ambulatory Visit | Attending: Family Medicine | Admitting: Family Medicine

## 2013-03-03 DIAGNOSIS — M25562 Pain in left knee: Secondary | ICD-10-CM

## 2013-08-08 ENCOUNTER — Encounter (HOSPITAL_COMMUNITY): Payer: Self-pay | Admitting: Emergency Medicine

## 2013-08-08 ENCOUNTER — Emergency Department (HOSPITAL_COMMUNITY)
Admission: EM | Admit: 2013-08-08 | Discharge: 2013-08-08 | Disposition: A | Discharge status: Home / Self Care | Payer: Medicaid Other | Attending: Emergency Medicine | Admitting: Emergency Medicine

## 2013-08-08 DIAGNOSIS — F1410 Cocaine abuse, uncomplicated: Secondary | ICD-10-CM | POA: Insufficient documentation

## 2013-08-08 DIAGNOSIS — F17200 Nicotine dependence, unspecified, uncomplicated: Secondary | ICD-10-CM | POA: Insufficient documentation

## 2013-08-08 DIAGNOSIS — F1110 Opioid abuse, uncomplicated: Secondary | ICD-10-CM | POA: Insufficient documentation

## 2013-08-08 DIAGNOSIS — F1910 Other psychoactive substance abuse, uncomplicated: Secondary | ICD-10-CM

## 2013-08-08 DIAGNOSIS — F1310 Sedative, hypnotic or anxiolytic abuse, uncomplicated: Secondary | ICD-10-CM | POA: Insufficient documentation

## 2013-08-08 LAB — ACETAMINOPHEN LEVEL: Acetaminophen (Tylenol), Serum: <15 ug/mL (ref 10–30)

## 2013-08-08 LAB — COMPREHENSIVE METABOLIC PANEL
ALT: 14 U/L (ref 0–53)
Alkaline Phosphatase: 61 U/L (ref 39–117)
BUN: 17 mg/dL (ref 6–23)
CO2: 27 mEq/L (ref 19–32)
Chloride: 102 mEq/L (ref 96–112)
GFR calc Af Amer: >90 mL/min (ref >90)
GFR calc non Af Amer: >90 mL/min (ref >90)
Glucose, Bld: 91 mg/dL (ref 70–99)
Potassium: 4.4 mEq/L (ref 3.5–5.1)
Sodium: 140 mEq/L (ref 135–145)
Total Bilirubin: 0.2 mg/dL — ABNORMAL LOW (ref 0.3–1.2)

## 2013-08-08 LAB — CBC
HCT: 39 % (ref 39.0–52.0)
Hemoglobin: 13.4 g/dL (ref 13.0–17.0)
MCHC: 34.4 g/dL (ref 30.0–36.0)
RBC: 4.5 MIL/uL (ref 4.22–5.81)

## 2013-08-08 LAB — RAPID URINE DRUG SCREEN, HOSP PERFORMED
Barbiturates: NOT DETECTED
Cocaine: POSITIVE — AB
Tetrahydrocannabinol: POSITIVE — AB

## 2013-08-08 LAB — ETHANOL: Alcohol, Ethyl (B): <11 mg/dL (ref 0–11)

## 2013-08-08 MED ORDER — ONDANSETRON HCL 4 MG PO TABS
4.0000 mg | ORAL_TABLET | Freq: Three times a day (TID) | ORAL | Status: DC | PRN
Start: 2013-08-08 — End: 2013-08-08

## 2013-08-08 MED ORDER — IBUPROFEN 200 MG PO TABS: 600.0000 mg | ORAL_TABLET | Freq: Three times a day (TID) | ORAL | Status: DC | PRN

## 2013-08-08 MED ORDER — LOPERAMIDE HCL 2 MG PO CAPS: 2.0000 mg | ORAL_CAPSULE | ORAL | Status: DC | PRN

## 2013-08-08 MED ORDER — CLONIDINE HCL 0.1 MG PO TABS: 0.1000 mg | ORAL_TABLET | ORAL | Status: DC

## 2013-08-08 MED ORDER — CLONIDINE HCL 0.1 MG PO TABS
0.1000 mg | ORAL_TABLET | Freq: Four times a day (QID) | ORAL | Status: DC
  Administered 2013-08-08: 0.1 mg via ORAL
  Filled 2013-08-08: qty 1

## 2013-08-08 MED ORDER — DICYCLOMINE HCL 20 MG PO TABS
20.0000 mg | ORAL_TABLET | Freq: Four times a day (QID) | ORAL | Status: DC | PRN
  Administered 2013-08-08: 20 mg via ORAL
  Filled 2013-08-08: qty 1

## 2013-08-08 MED ORDER — HYDROXYZINE HCL 25 MG PO TABS
25.0000 mg | ORAL_TABLET | Freq: Four times a day (QID) | ORAL | Status: DC | PRN
  Administered 2013-08-08: 25 mg via ORAL
  Filled 2013-08-08: qty 1

## 2013-08-08 MED ORDER — NICOTINE 21 MG/24HR TD PT24
21.0000 mg | MEDICATED_PATCH | Freq: Every day | TRANSDERMAL | Status: DC
  Administered 2013-08-08: 21 mg via TRANSDERMAL
  Filled 2013-08-08: qty 1

## 2013-08-08 MED ORDER — NAPROXEN 500 MG PO TABS: 500.0000 mg | ORAL_TABLET | Freq: Two times a day (BID) | ORAL | Status: DC | PRN

## 2013-08-08 MED ORDER — METHOCARBAMOL 500 MG PO TABS: 500.0000 mg | ORAL_TABLET | Freq: Three times a day (TID) | ORAL | Status: DC | PRN

## 2013-08-08 MED ORDER — ONDANSETRON 4 MG PO TBDP: 4.0000 mg | ORAL_TABLET | Freq: Four times a day (QID) | ORAL | Status: DC | PRN

## 2013-08-08 MED ORDER — CLONIDINE HCL 0.1 MG PO TABS: 0.1000 mg | ORAL_TABLET | Freq: Every day | ORAL | Status: DC

## 2013-08-08 NOTE — ED Provider Notes (Signed)
Medical screening examination/treatment/procedure(s) were performed by non-physician practitioner and as supervising physician I was immediately available for consultation/collaboration.  EKG Interpretation   None         Gwyneth Sprout, MD 08/08/13 0501

## 2013-08-08 NOTE — ED Provider Notes (Signed)
Patient seen by psychiatry and cleared for discharge  Toy Baker, MD 08/08/13 1520

## 2013-08-08 NOTE — ED Provider Notes (Signed)
CSN: [id number]     Arrival date & time 08/08/13  0050 History   First MD Initiated Contact with Patient 08/08/13 0111     Chief Complaint  Patient presents with  . Medical Clearance   (Consider location/radiation/quality/duration/timing/severity/associated sxs/prior Treatment) HPI Comments: Is here requesting detox from heroine Xanax and cocaine states he did have one rehabilitation admission in May that lasted approximately 3 weeks after discharge he feels like he will not be able to [facility] on it he feels he needs to be walked away from any source of his drug of choice.  The history is provided by the patient.    History reviewed. No pertinent past medical history. Past Surgical History  Procedure Laterality Date  . Knee arthroscopy    . Shoulder arthroscopy     History reviewed. No pertinent family history. History  Substance Use Topics  . Smoking status: Current Some Day Smoker  . Smokeless tobacco: Not on file  . Alcohol Use: Yes    Review of Systems  Respiratory: Negative for shortness of breath.   Cardiovascular: Negative for chest pain.  Genitourinary: Negative for dysuria.  Neurological: Negative for dizziness and headaches.  Psychiatric/Behavioral: Negative for suicidal ideas. The patient is not nervous/anxious.   All other systems reviewed and are negative.    Allergies  Other  Home Medications  No current outpatient prescriptions on file. BP 124/67  Pulse 73  Temp(Src) 98.7 F (37.1 C) (Oral)  Resp 16  SpO2 97% Physical Exam  Vitals reviewed. Constitutional: He is oriented to person, place, and time. He appears well-developed and well-nourished.  HENT:  Head: Normocephalic.  Eyes: Pupils are equal, round, and reactive to light.  Neck: Normal range of motion.  Cardiovascular: Normal rate and regular rhythm.   Musculoskeletal: Normal range of motion.  Neurological: He is alert and oriented to person, place, and time.  Skin: Skin is warm.    ED  Course  Procedures (including critical care time) Labs Review Labs Reviewed  COMPREHENSIVE METABOLIC PANEL - Abnormal; Notable for the following:    Total Bilirubin 0.2 (*)    All other components within normal limits  SALICYLATE LEVEL - Abnormal; Notable for the following:    Salicylate Lvl <2.0 (*)    All other components within normal limits  URINE RAPID DRUG SCREEN (HOSP PERFORMED) - Abnormal; Notable for the following:    Opiates POSITIVE (*)    Cocaine POSITIVE (*)    Benzodiazepines POSITIVE (*)    Tetrahydrocannabinol POSITIVE (*)    All other components within normal limits  ACETAMINOPHEN LEVEL  CBC  ETHANOL   Imaging Review No results found.  EKG Interpretation   None       MDM  No diagnosis found. Patient community he is medically cleared for evaluation by TTS there are potential placement in a rehabilitation facility     Arman Filter, NP 08/08/13 [phone]

## 2013-08-08 NOTE — BH Assessment (Signed)
Received request for tele-assessment. Spoke with Earley Favor, NP who said Pt is abusing heroin, Xanax and cocaine and seeking detox. Tele-assessment will be initiated.  Harlin Rain Ria Comment, [facility] Triage Specialist

## 2013-08-08 NOTE — Consult Note (Signed)
[facility] Face-to-Face Psychiatry Consult   Reason for Consult:  "I need detox" Referring Physician:  EDP  James Crane is an 22 y.o. male.  Assessment: AXIS I:  Substance Abuse AXIS II:  Deferred AXIS III:  History reviewed. No pertinent past medical history. AXIS IV:  other psychosocial or environmental problems AXIS V:  51-60 moderate symptoms  Plan:  No evidence of imminent risk to self or others at present.   Patient does not meet criteria for psychiatric inpatient admission. Supportive therapy provided about ongoing stressors. Discussed crisis plan, support from social network, calling 911, coming to the Emergency Department, and calling Suicide Hotline. Pt is to follow up at [facility] for rehab.  Subjective:   James Crane is a 22 y.o. male patient admitted with requesting detox from heroin, xanax, and cocaine. Pt reports his drug of choice is heroin (1/4 gm/day for 1.5 yrs, IV use). He reports using xanax 1-2 times a month, and last took a 1/2 pill yesterday (he reports a history of using daily xanax 1.5 yrs ago). He denies using cocaine daily, and reports last using over 1 week ago. He reports drinking alcohol 1-2 times a month, and last drank 5 shots 1-2 days ago. He used to drink daily 1 yr ago. He denies a history of depression. He reports poor sleep for last few days. Appetite has been fine. He went to detox in May 2014 at [facility], but did not follow up with outpatient treatment. No history of complicated withdrawal (no withdrawal seizures) or DTs. He denies a history of psychotropic medications. No history of suicide attempts. He lives with his girlfriend. He wants to f/u at [facility] for 9 month rehab. He denies SI/HI/AVH.    Past Psychiatric History: History reviewed. No pertinent past medical history.  reports that he has been smoking.  He does not have any smokeless tobacco history on file. He reports that he drinks alcohol. He reports that he uses  illicit drugs (Marijuana, Cocaine, and Heroin). History reviewed. No pertinent family history. Family History Substance Abuse: No Family Supports: No Living Arrangements: Alone Can pt return to current living arrangement?: Yes Abuse/Neglect [facility]) Physical Abuse: Denies Verbal Abuse: Denies Sexual Abuse: Denies Allergies:   Allergies  Allergen Reactions  . Other Nausea And Vomiting    "Anesthesia"- unknown specific medication name    ACT Assessment Complete:  Yes:    Educational Status    Risk to Self: Risk to self Suicidal Ideation: No Suicidal Intent: No Is patient at risk for suicide?: No Suicidal Plan?: No Access to Means: No What has been your use of drugs/alcohol within the last 12 months?: Abusing: heroin, cocaine, thc, benzos  Previous Attempts/Gestures: No How many times?: 0 Other Self Harm Risks: None  Triggers for Past Attempts: None known Intentional Self Injurious Behavior: None Family Suicide History: No Recent stressful life event(s): Other (Comment) (Chronic SA ) Persecutory voices/beliefs?: No Depression: Yes Depression Symptoms: Loss of interest in usual pleasures;Guilt Substance abuse history and/or treatment for substance abuse?: Yes Suicide prevention information given to non-admitted patients: Not applicable  Risk to Others: Risk to Others Homicidal Ideation: No Thoughts of Harm to Others: No Current Homicidal Intent: No Current Homicidal Plan: No Access to Homicidal Means: No Identified Victim: None History of harm to others?: No Assessment of Violence: None Noted Violent Behavior Description: None  Does patient have access to weapons?: No Criminal Charges Pending?: No Does patient have a court date: No  Abuse:  Abuse/Neglect Assessment (Assessment to be complete while patient is alone) Physical Abuse: Denies Verbal Abuse: Denies Sexual Abuse: Denies Exploitation of patient/patient's resources: Denies Self-Neglect: Denies  Prior Inpatient  Therapy: Prior Inpatient Therapy Prior Inpatient Therapy: Yes Prior Therapy Dates: 2014 Prior Therapy Facilty[facility]Provider[facility]  Reason for Treatment: Detox   Prior Outpatient Therapy: Prior Outpatient Therapy Prior Outpatient Therapy: No Prior Therapy Dates: None  Prior Therapy Facilty/Provider(s): None  Reason for Treatment: None   Additional Information: Additional Information 1:1 In Past 12 Months?: No CIRT Risk: No Elopement Risk: No Does patient have medical clearance?: Yes                  Objective: Blood pressure 135/91, pulse 74, temperature 97.8 F (36.6 C), temperature source Oral, resp. rate 18, SpO2 100.00%.There is no weight on file to calculate BMI. Results for orders placed during the hospital encounter of 08/08/13 (from the past 72 hour(s))  ACETAMINOPHEN LEVEL     Status: None   Collection Time    08/08/13  1:50 AM      Result Value Range   Acetaminophen (Tylenol), Serum <15.0  10 - 30 ug/mL   Comment:            THERAPEUTIC CONCENTRATIONS VARY     SIGNIFICANTLY. A RANGE OF 10-30     ug/mL MAY BE AN EFFECTIVE     CONCENTRATION FOR MANY PATIENTS.     HOWEVER, SOME ARE BEST TREATED     AT CONCENTRATIONS OUTSIDE THIS     RANGE.     ACETAMINOPHEN CONCENTRATIONS     >150 ug/mL AT 4 HOURS AFTER     INGESTION AND >50 ug/mL AT 12     HOURS AFTER INGESTION ARE     OFTEN ASSOCIATED WITH TOXIC     REACTIONS.  CBC     Status: None   Collection Time    08/08/13  1:50 AM      Result Value Range   WBC 9.8  4.0 - 10.5 K/uL   RBC 4.50  4.22 - 5.81 MIL/uL   Hemoglobin 13.4  13.0 - 17.0 g/dL   HCT 45.4  09.8 - 11.9 %   MCV 86.7  78.0 - 100.0 fL   MCH 29.8  26.0 - 34.0 pg   MCHC 34.4  30.0 - 36.0 g/dL   RDW 14.7  82.9 - 56.2 %   Platelets 273  150 - 400 K/uL  COMPREHENSIVE METABOLIC PANEL     Status: Abnormal   Collection Time    08/08/13  1:50 AM      Result Value Range   Sodium 140  135 - 145 mEq/L   Potassium 4.4  3.5 -  5.1 mEq/L   Chloride 102  96 - 112 mEq/L   CO2 27  19 - 32 mEq/L   Glucose, Bld 91  70 - 99 mg/dL   BUN 17  6 - 23 mg/dL   Creatinine, Ser 1.30  0.50 - 1.35 mg/dL   Calcium 9.6  8.4 - 86.5 mg/dL   Total Protein 7.9  6.0 - 8.3 g/dL   Albumin 4.0  3.5 - 5.2 g/dL   AST 15  0 - 37 U/L   ALT 14  0 - 53 U/L   Alkaline Phosphatase 61  39 - 117 U/L   Total Bilirubin 0.2 (*) 0.3 - 1.2 mg/dL   GFR calc non Af Amer >90  >90 mL/min   GFR calc Af Amer >  90  >90 mL/min   Comment: (NOTE)     The eGFR has been calculated using the CKD EPI equation.     This calculation has not been validated in all clinical situations.     eGFR's persistently <90 mL/min signify possible Chronic Kidney     Disease.  ETHANOL     Status: None   Collection Time    08/08/13  1:50 AM      Result Value Range   Alcohol, Ethyl (B) <11  0 - 11 mg/dL   Comment:            LOWEST DETECTABLE LIMIT FOR     SERUM ALCOHOL IS 11 mg/dL     FOR MEDICAL PURPOSES ONLY  SALICYLATE LEVEL     Status: Abnormal   Collection Time    08/08/13  1:50 AM      Result Value Range   Salicylate Lvl <2.0 (*) 2.8 - 20.0 mg/dL  URINE RAPID DRUG SCREEN (HOSP PERFORMED)     Status: Abnormal   Collection Time    08/08/13  1:56 AM      Result Value Range   Opiates POSITIVE (*) NONE DETECTED   Cocaine POSITIVE (*) NONE DETECTED   Benzodiazepines POSITIVE (*) NONE DETECTED   Amphetamines NONE DETECTED  NONE DETECTED   Tetrahydrocannabinol POSITIVE (*) NONE DETECTED   Barbiturates NONE DETECTED  NONE DETECTED   Comment:            DRUG SCREEN FOR MEDICAL PURPOSES     ONLY.  IF CONFIRMATION IS NEEDED     FOR ANY PURPOSE, NOTIFY LAB     WITHIN 5 DAYS.                LOWEST DETECTABLE LIMITS     FOR URINE DRUG SCREEN     Drug Class       Cutoff (ng/mL)     Amphetamine      1000     Barbiturate      200     Benzodiazepine   200     Tricyclics       300     Opiates          300     Cocaine          300     THC              50   Labs are  reviewed and are pertinent for UDS + for opiates, cocaine, benzos, and THC. ETOH negative.  Current Facility-Administered Medications  Medication Dose Route Frequency Provider Last Rate Last Dose  . ibuprofen (ADVIL,MOTRIN) tablet 600 mg  600 mg Oral Q8H PRN Arman Filter, NP      . nicotine (NICODERM CQ - dosed in mg/24 hours) patch 21 mg  21 mg Transdermal Daily Arman Filter, NP   21 mg at 08/08/13 0913  . ondansetron (ZOFRAN) tablet 4 mg  4 mg Oral Q8H PRN Arman Filter, NP       No current outpatient prescriptions on file.    Psychiatric Specialty Exam:     Blood pressure 135/91, pulse 74, temperature 97.8 F (36.6 C), temperature source Oral, resp. rate 18, SpO2 100.00%.There is no weight on file to calculate BMI.  General Appearance: Casual and Fairly Groomed  Patent attorney::  Fair  Speech:  Normal Rate  Volume:  Normal  Mood:  Euthymic  Affect:  Appropriate, Congruent and Constricted  Thought Process:  Coherent and Goal Directed  Orientation:  Full (Time, Place, and Person)  Thought Content:  Hallucinations: None  Suicidal Thoughts:  No  Homicidal Thoughts:  No  Memory:  Immediate;   Fair Recent;   Fair Remote;   Fair  Judgement:  Fair  Insight:  Fair  Psychomotor Activity:  Normal  Concentration:  Fair  Recall:  Fair  Akathisia:  Negative  Handed:  Right  AIMS (if indicated):     Assets:  Communication Skills Desire for Improvement Financial Resources/Insurance Housing Physical Health Social Support Transportation Vocational/Educational  Sleep:      Treatment Plan Summary: Pt is motivated to seek rehab at [facility]. Pt reports that his mom already completed the intake for him at [facility]. He states that his girlfriend will pick him up today, to take him to [facility]. Will discharge patient from ED, with follow up at [facility].  Ancil Linsey 08/08/2013 1:08 PM

## 2013-08-08 NOTE — BH Assessment (Signed)
Assessment Note  James Crane is a 22 y.o. male who presents to [facility] to for detox.  Pt denies SI/HI/AVH.  Pt says he wants to stop drugs because he's losing his family.  Pt reports the following: he uses 1 gram of heroin, daily, last use was 08/08/13.  Pt has track marks on both arms.  Pt uses 2 bags of cocaine, 2x's monthly, last use unk .  Pt says he takes "1 hit" of marijuana occasionally, last use unk.  Pt also uses benzos(xanax), states uses 1/2 pill, wkly, last use unk.  Pt denies any seizure/blackout activity and has no current w/d sxs.  Pt has a court date scheduled in 08/2013 for "broken tail light'.  Pt has 1 previous inpt admission with [facility] for detox in 2014.  Axis I: Opioid Dependence; Cannabis Abuse; Cocaine Abuse; Sedative, Hypnotic, or Anxiolytic Abuse  Axis II: Deferred Axis III: History reviewed. No pertinent past medical history. Axis IV: other psychosocial or environmental problems, problems related to legal system/crime and problems with primary support group Axis V: 51-60 moderate symptoms  Past Medical History: History reviewed. No pertinent past medical history.  Past Surgical History  Procedure Laterality Date  . Knee arthroscopy    . Shoulder arthroscopy      Family History: History reviewed. No pertinent family history.  Social History:  reports that he has been smoking.  He does not have any smokeless tobacco history on file. He reports that he drinks alcohol. He reports that he uses illicit drugs (Marijuana, Cocaine, and Heroin).  Additional Social History:  Alcohol / Drug Use Pain Medications: None  Prescriptions: None  Over the Counter: None  History of alcohol / drug use?: Yes Longest period of sobriety (when/how long): Only when in detox  Negative Consequences of Use: Personal relationships;Financial Withdrawal Symptoms: Other (Comment) (No current w/d sxs ) Substance #1 Name of Substance 1: Heroin--DOC  1 - Age of First Use: 21 YOM   1 - Amount (size/oz): 1 Gram  1 - Frequency: Daily 1 - Duration: On-going  1 - Last Use / Amount: 08/08/13 Substance #2 Name of Substance 2: Cocaine  2 - Age of First Use: 21 YOM  2 - Amount (size/oz): 2 Bags  2 - Frequency: 2x's monthly  2 - Duration: On-going  2 - Last Use / Amount: Unk  Substance #3 Name of Substance 3: THC  3 - Age of First Use: Teens  3 - Amount (size/oz): "1 hit" 3 - Frequency: Monthly 3 - Duration: On-going  3 - Last Use / Amount: Unk  Substance #4 Name of Substance 4: Benzos--Xanax  4 - Age of First Use: 19 YOM  4 - Amount (size/oz): 1/2 Wkly  4 - Frequency: Wkly  4 - Duration: On-going  4 - Last Use / Amount: Unk   CIWA: CIWA-Ar BP: 120/55 mmHg Pulse Rate: 69 COWS:    Allergies:  Allergies  Allergen Reactions  . Other Nausea And Vomiting    "Anesthesia"- unknown specific medication name    Home Medications:  (Not in a hospital admission)  OB/GYN Status:  No LMP for male patient.  General Assessment Data Location of Assessment: [facility] Is this a Tele or Face-to-Face Assessment?: Face-to-Face Is this an Initial Assessment or a Re-assessment for this encounter?: Initial Assessment Living Arrangements: Alone Can pt return to current living arrangement?: Yes Admission Status: Voluntary Is patient capable of signing voluntary admission?: Yes Transfer from: Acute Hospital Referral Source: MD  Medical Screening Exam [facility] ONLY) Medical Exam completed: No Reason for MSE not completed: Other: (None )  [facility] Crisis Care Plan Living Arrangements: Alone Name of Psychiatrist: None  Name of Therapist: None   Education Status Is patient currently in school?: No Current Grade: None  Highest grade of school patient has completed: None  Name of school: None  Contact person: None   Risk to self Suicidal Ideation: No Suicidal Intent: No Is patient at risk for suicide?: No Suicidal Plan?: No Access to Means: No What has been your  use of drugs/alcohol within the last 12 months?: Abusing: heroin, cocaine, thc, benzos  Previous Attempts/Gestures: No How many times?: 0 Other Self Harm Risks: None  Triggers for Past Attempts: None known Intentional Self Injurious Behavior: None Family Suicide History: No Recent stressful life event(s): Other (Comment) (Chronic SA ) Persecutory voices/beliefs?: No Depression: Yes Depression Symptoms: Loss of interest in usual pleasures;Guilt Substance abuse history and/or treatment for substance abuse?: Yes Suicide prevention information given to non-admitted patients: Not applicable  Risk to Others Homicidal Ideation: No Thoughts of Harm to Others: No Current Homicidal Intent: No Current Homicidal Plan: No Access to Homicidal Means: No Identified Victim: None History of harm to others?: No Assessment of Violence: None Noted Violent Behavior Description: None  Does patient have access to weapons?: No Criminal Charges Pending?: No Does patient have a court date: No  Psychosis Hallucinations: None noted Delusions: None noted  Mental Status Report Appear/Hygiene: Disheveled Eye Contact: Fair Motor Activity: Unremarkable Speech: Logical/coherent Level of Consciousness: Alert Mood: Depressed;Sad Affect: Depressed;Sad Anxiety Level: None Thought Processes: Coherent;Relevant Judgement: Unimpaired Orientation: Place;Person;Time;Situation Obsessive Compulsive Thoughts/Behaviors: None  Cognitive Functioning Concentration: Normal Memory: Recent Intact;Remote Intact IQ: Average Insight: Fair Impulse Control: Fair Appetite: Fair Weight Loss: 0 Weight Gain: 0 Sleep: No Change Total Hours of Sleep: 4 Vegetative Symptoms: None  ADLScreening [facility] Assessment Services) Patient's cognitive ability adequate to safely complete daily activities?: Yes Patient able to express need for assistance with ADLs?: Yes Independently performs ADLs?: Yes (appropriate for developmental  age)  Prior Inpatient Therapy Prior Inpatient Therapy: Yes Prior Therapy Dates: 2014 Prior Therapy Facilty/Provider(s): [facility]  Reason for Treatment: Detox   Prior Outpatient Therapy Prior Outpatient Therapy: No Prior Therapy Dates: None  Prior Therapy Facilty/Provider(s): None  Reason for Treatment: None   ADL Screening (condition at time of admission) Patient's cognitive ability adequate to safely complete daily activities?: Yes Is the patient deaf or have difficulty hearing?: No Does the patient have difficulty seeing, even when wearing glasses/contacts?: No Does the patient have difficulty concentrating, remembering, or making decisions?: No Patient able to express need for assistance with ADLs?: Yes Does the patient have difficulty dressing or bathing?: No Independently performs ADLs?: Yes (appropriate for developmental age) Does the patient have difficulty walking or climbing stairs?: No Weakness of Legs: None Weakness of Arms/Hands: None  Home Assistive Devices/Equipment Home Assistive Devices/Equipment: None  Therapy Consults (therapy consults require a physician order) PT Evaluation Needed: No OT Evalulation Needed: No SLP Evaluation Needed: No Abuse/Neglect Assessment (Assessment to be complete while patient is alone) Physical Abuse: Denies Verbal Abuse: Denies Sexual Abuse: Denies Exploitation of patient/patient's resources: Denies Self-Neglect: Denies Values / Beliefs Cultural Requests During Hospitalization: None Spiritual Requests During Hospitalization: None Consults Spiritual Care Consult Needed: No Social Work Consult Needed: No Merchant navy officer (For Healthcare) Advance Directive: Patient does not have advance directive;Patient would not like information Pre-existing out of facility DNR order (yellow form or  pink MOST form): No Nutrition Screen- [facility] Adult/[facility] Patient's home diet: Regular  Additional Information 1:1 In Past  12 Months?: No CIRT Risk: No Elopement Risk: No Does patient have medical clearance?: Yes     Disposition:  Disposition Initial Assessment Completed for this Encounter: Yes Disposition of Patient: Inpatient treatment program;Referred to (Pt will need referral to [facility], [facility] ) Type of inpatient treatment program: Adult Patient referred to: Other (Comment) (Pt will need referral to [facility], [facility] )  On Site Evaluation by:   Reviewed with Physician:    Murrell Redden 08/08/2013 6:21 AM

## 2013-08-08 NOTE — ED Notes (Signed)
Pt arrived to the ED with a need for medical clearance.  Pt is detoxing off of heroin, xanax, and cocaine.  Pt's last usage was heroin and xanax which was Sunday.  Pt is voluntary at this time.  Pt is calm and cooperative.

## 2013-08-08 NOTE — Progress Notes (Signed)
Per discussion with RN, patient requesting to discharge home. Pt denied SI/HI/AH/VH. CSW discussed with psychiatrist, who plans to dc patient. CSW will provide patient with outpatient resources.   Catha Gosselin, LCSW [phone]  ED CSW td 1413pm

## 2013-08-08 NOTE — Progress Notes (Addendum)
Pt referred to [facility] and [facility] pending review.  Pt referred to [facility] pending review.  CSW left message for [facility].   Pt interested in [facility]. Pt to complete on the phone application at 3pm with [facility]. Patient reprots he currently hurts all over and his nauseas. CSW discussed with psychiatrist and NP and medications ordered.   Frutoso Schatz [phone]  ED CSW 08/08/2013 1253pm

## 2013-11-10 ENCOUNTER — Encounter (HOSPITAL_COMMUNITY): Payer: Self-pay | Admitting: Emergency Medicine

## 2013-11-10 ENCOUNTER — Emergency Department (HOSPITAL_COMMUNITY)
Admission: EM | Admit: 2013-11-10 | Discharge: 2013-11-10 | Disposition: A | Discharge status: Home / Self Care | Payer: Medicaid Other | Attending: Emergency Medicine | Admitting: Emergency Medicine

## 2013-11-10 DIAGNOSIS — K921 Melena: Secondary | ICD-10-CM | POA: Insufficient documentation

## 2013-11-10 DIAGNOSIS — K5900 Constipation, unspecified: Secondary | ICD-10-CM | POA: Insufficient documentation

## 2013-11-10 DIAGNOSIS — R000 Tachycardia, unspecified: Secondary | ICD-10-CM | POA: Insufficient documentation

## 2013-11-10 DIAGNOSIS — F411 Generalized anxiety disorder: Secondary | ICD-10-CM | POA: Insufficient documentation

## 2013-11-10 DIAGNOSIS — Z008 Encounter for other general examination: Secondary | ICD-10-CM

## 2013-11-10 DIAGNOSIS — F17200 Nicotine dependence, unspecified, uncomplicated: Secondary | ICD-10-CM | POA: Insufficient documentation

## 2013-11-10 DIAGNOSIS — F1110 Opioid abuse, uncomplicated: Secondary | ICD-10-CM | POA: Insufficient documentation

## 2013-11-10 HISTORY — DX: Opioid dependence, uncomplicated: F11.20

## 2013-11-10 LAB — COMPREHENSIVE METABOLIC PANEL
ALT: 31 U/L (ref 0–53)
AST: 19 U/L (ref 0–37)
Albumin: 4.5 g/dL (ref 3.5–5.2)
Alkaline Phosphatase: 61 U/L (ref 39–117)
Calcium: 10.1 mg/dL (ref 8.4–10.5)
Chloride: 99 mEq/L (ref 96–112)
Glucose, Bld: 110 mg/dL — ABNORMAL HIGH (ref 70–99)
Potassium: 4.3 mEq/L (ref 3.7–5.3)
Sodium: 139 mEq/L (ref 137–147)
Total Protein: 8.3 g/dL (ref 6.0–8.3)

## 2013-11-10 LAB — ETHANOL: Alcohol, Ethyl (B): <11 mg/dL (ref 0–11)

## 2013-11-10 LAB — CBC
HCT: 40.8 % (ref 39.0–52.0)
Hemoglobin: 14.6 g/dL (ref 13.0–17.0)
MCH: 30.9 pg (ref 26.0–34.0)
MCHC: 35.8 g/dL (ref 30.0–36.0)
MCV: 86.4 fL (ref 78.0–100.0)
Platelets: 314 10*3/uL (ref 150–400)
RBC: 4.72 MIL/uL (ref 4.22–5.81)
RDW: 13.2 % (ref 11.5–15.5)
WBC: 8 K/uL (ref 4.0–10.5)

## 2013-11-10 LAB — SALICYLATE LEVEL: Salicylate Lvl: <2 mg/dL — ABNORMAL LOW (ref 2.8–20.0)

## 2013-11-10 LAB — ACETAMINOPHEN LEVEL: Acetaminophen (Tylenol), Serum: <15 ug/mL (ref 10–30)

## 2013-11-10 LAB — COMPREHENSIVE METABOLIC PANEL WITH GFR
BUN: 16 mg/dL (ref 6–23)
CO2: 24 meq/L (ref 19–32)
Creatinine, Ser: 0.99 mg/dL (ref 0.50–1.35)
GFR calc Af Amer: >90 mL/min (ref >90)
GFR calc non Af Amer: >90 mL/min (ref >90)
Total Bilirubin: 0.3 mg/dL (ref 0.3–1.2)

## 2013-11-10 LAB — POC OCCULT BLOOD, ED: Fecal Occult Bld: POSITIVE — AB

## 2013-11-10 NOTE — Discharge Instructions (Signed)
1. Medications: usual home medications 2. Treatment: rest, drink plenty of fluids,  3. Follow Up: Please followup with your primary doctor or gastroenterology for discussion of your diagnoses and further evaluation after today's visit       Behavioral Health Resources in the [facility]  Intensive Outpatient Programs: [facility]      [address] [place], [place] [phone] Both a day and evening program       [facility]     [address]        [place], [place] [address] [phone]         ADS: [facility] [address] [place] [phone]  [facility] ACCESS LINE: [phone] or [phone] [address] [place], [place] [address] [email or web address]  Mobile Crisis Teams:                                        [facility]         Mobile Crisis Care Unit [phone]             [facility] [address] [place] [phone]                                         Interventionist [address]haron DeEsch [address] [place] [phone]  Self-Help/Support Groups: Mental Health Assoc. of [facility] [place] Variety of support groups [phone] (call for more info)  Narcotics Anonymous (NA) Caring Services [address] [place] - 2 meetings at this location  Residential Treatment Programs:  [facility]      [address]        [place]       [phone]         [facility] [address], [place] [address] [place], [place]  [phone] [phone]  [facility]  [address] [place], [place] [address] [phone] Admissions: 8am-3pm M-F  [facility]     [address]        [place], [place] [address]       [phone]         [facility] [address] [place],  [place] [phone]  [facility] [address] [place], [place] [phone]  [facility] - Intensive Outpatient      [address]     [place], [place]       [phone]         [facility]     [address] [place], [place] [phone] or [phone]  [facility] ([facility])  [address] [place], [place] [phone]  [facility] [address]                                               [address] [place] [phone]  [facility] Resources: [phone]               General Therapy  Julie Brannon, PhD        [address]                                       [place], [place] [address]         [phone]   Insurance  [facility]   [address] [place], [place] [address] [phone]  [facility] [address] [place], [place] 2Angie Fava952827375 [phone] Insurance/Medicaid/sponsorship through [facility]  [facility] Families                                              [address]                                        [place], [place] [address]    Therapy/tele-psych/case         [phone]          [facility] [address].   [place], [place]  [address]  Adolescent/group home/case management [phone]                                           Creola CornJulia Brannon PhD       General therapy       Insurance   [phone]         Dr. Lolly MustacheArfeen Insurance [phone] M-F  [facility] Medicaid, sponsorship [phone]

## 2013-11-10 NOTE — ED Notes (Signed)
Paperwork stating pt [facility] been [facility]

## 2013-11-10 NOTE — ED Notes (Signed)
Pt was told to come here and be medically cleared [facility] [place]   Pt told [facility] staff that he had 1 day of 3 bloody stools (stool with bloody water) last week.  Pt has not seen blood in stool since then, though he continues to be constipated.  Denies abdominal pain, nausea, etc.  Pt last used IV heroin today at 1245.

## 2013-11-10 NOTE — ED Provider Notes (Signed)
CSN: [id number]     Arrival date & time 11/10/13  1527 History  This chart was scribed for non-physician practitioner Dierdre Forth, PA-C working with Gavin Pound. Oletta Lamas, MD by Donne Anon, ED Scribe. This patient was seen in room TR11C/TR11C and the patient's care was started at 1728.    First MD Initiated Contact with Patient 11/10/13 1728     Chief Complaint  Patient presents with  . Rectal Bleeding    The history is provided by the patient and medical records. No language interpreter was used.   HPI Comments: James Crane is a 23 y.o. male who presents to the Emergency Department needing medical clearance for rectal bleeding. He [facility] for heroin use. He states that 1 week ago he had 3 bloody stools over the course of 3 days. He states the first BM was painful, but the other 2 were not. He states the stool was brown but there was blood in the water of the toilet. He states there was a small amount of blood when he wiped. He has not seen blood in his stool since thenn, though he continues to be constipated. He denies blood in his underwear.  He denies a hx of hemorrhoids or rectal bleeding. He denies diarrhea, abdominal pain, nausea, vomiting, or any other symptoms. He reports that he is regularly constipated due to the heroin use.     Past Medical History  Diagnosis Date  . Heroin addiction    Past Surgical History  Procedure Laterality Date  . Knee arthroscopy    . Shoulder arthroscopy     No family history on file. History  Substance Use Topics  . Smoking status: Current Some Day Smoker -- 0.50 packs/day    Types: Cigarettes  . Smokeless tobacco: Not on file  . Alcohol Use: No    Review of Systems  Constitutional: Negative for diaphoresis, appetite change, fatigue and unexpected weight change.  HENT: Negative for mouth sores.   Eyes: Negative for visual disturbance.  Respiratory: Negative for cough, chest tightness, shortness of breath and  wheezing.   Cardiovascular: Negative for chest pain.  Gastrointestinal: Positive for constipation and blood in stool. Negative for nausea, abdominal pain and diarrhea.  Endocrine: Negative for polydipsia, polyphagia and polyuria.  Genitourinary: Negative for dysuria, urgency, frequency and hematuria.  Musculoskeletal: Negative for back pain and neck stiffness.  Skin: Negative for rash.  Allergic/Immunologic: Negative for immunocompromised state.  Neurological: Negative for syncope and headaches.  Hematological: Does not bruise/bleed easily.  Psychiatric/Behavioral: Negative for sleep disturbance. The patient is not nervous/anxious.       Allergies  Other  Home Medications  No current outpatient prescriptions on file.  BP 93/52  Pulse 82  Temp(Src) 98.6 F (37 C) (Oral)  Resp 16  Ht 6\' 2"  (1.88 m)  Wt 163 lb (73.936 kg)  BMI 20.92 kg/m2  SpO2 99%  Physical Exam  Nursing note and vitals reviewed. Constitutional: He is oriented to person, place, and time. He appears well-developed and well-nourished. No distress.  Awake, alert, nontoxic appearance  HENT:  Head: Normocephalic and atraumatic.  Mouth/Throat: Oropharynx is clear and moist. No oropharyngeal exudate.  Eyes: Conjunctivae are normal. No scleral icterus.  Neck: Normal range of motion. Neck supple.  Cardiovascular: Normal rate, regular rhythm, normal heart sounds and intact distal pulses.   No murmur heard. No tachycardia  Pulmonary/Chest: Effort normal and breath sounds normal. No respiratory distress. He has no wheezes.  Abdominal:  Soft. Bowel sounds are normal. He exhibits no distension and no mass. There is no tenderness. There is no rebound and no guarding.  Abd soft and nontender  Genitourinary: Rectal exam shows no external hemorrhoid, no internal hemorrhoid, no fissure, no mass, no tenderness and anal tone normal. Guaiac positive stool. Prostate is not enlarged and not tender.  Examination chaperoned by  scribe Donne Anonayla Curran. No visible or palpable internal or external hemorrhoids. Hard stool in rectal vault. No bloody stools or frank blood on exam.  Prostate is not boggy, nonenlarged and nontender   Musculoskeletal: Normal range of motion. He exhibits no edema.  Neurological: He is alert and oriented to person, place, and time. He exhibits normal muscle tone. Coordination normal.  Speech is clear and goal oriented Moves extremities without ataxia  Skin: Skin is warm and dry. He is not diaphoretic. No erythema.  Psychiatric: He has a normal mood and affect.    ED Course  Procedures (including critical care time) DIAGNOSTIC STUDIES: Oxygen Saturation is 100% on RA, normal by my interpretation.    COORDINATION OF CARE: 5:29 PM Discussed treatment plan which includes hemoccult and labs with pt at bedside and pt agreed to plan.   5:59 PM Lab results reviewed. Case discussed with Dr. Oletta LamasGhim, who advised that pt is still stable to [facility].   7:10 PM Rechecked pt. Discussed labs.  Labs Review Labs Reviewed  COMPREHENSIVE METABOLIC PANEL - Abnormal; Notable for the following:    Glucose, Bld 110 (*)    All other components within normal limits  SALICYLATE LEVEL - Abnormal; Notable for the following:    Salicylate Lvl <2.0 (*)    All other components within normal limits  POC OCCULT BLOOD, ED - Abnormal; Notable for the following:    Fecal Occult Bld POSITIVE (*)    All other components within normal limits  ACETAMINOPHEN LEVEL  CBC  ETHANOL   Imaging Review No results found.   EKG Interpretation None      MDM   Final diagnoses:  Medical clearance for psychiatric admission   Pt with 3 episodes of BRBPR with brown stool last week.  GU exam without evidence of BRB and brown stool on glove.  Hard stool in rectal vault.  NO external or internal hemorrhoids palpable or visible; no evidence of anal fissure.    Pt without evidence of hemodynamic instability.  Normal BP with  normal Hgb.  Mild tachycardia, but pt is anxious and last heroin use was earlier today.  No evidence on history or physical of diverticulitis. Low risk for colon cancer.  Discussed with Quita SkyeMichael Ghim, MD who agrees that patient can be medically cleared for Rehab with the recommendation of sigmoidoscopy if bleeding persists.    6:54 PM Fecal Occult positive.  [facility] who will review and let us know about his placement.  Patient is stable and medically cleared for detox.  It has been determined that no acute conditions requiring further emergency intervention are present at this time. The patient/guardian have been advised of the diagnosis and plan. We have discussed signs and symptoms that warrant return to the ED, such as changes or worsening in symptoms.   Vital signs are stable at discharge.   BP 93/52  Pulse 82  Temp(Src) 98.6 F (37 C) (Oral)  Resp 16  Ht 6\' 2"  (1.88 m)  Wt 163 lb (73.936 kg)  BMI 20.92 kg/m2  SpO2 99%  Patient/guardian has voiced understanding  and agreed to follow-up with the PCP or specialist.    I personally performed the services described in this documentation, which was scribed in my presence. The recorded information has been reviewed and is accurate.   Dierdre Forth, PA-C 11/10/13 1921

## 2013-11-12 ENCOUNTER — Encounter (HOSPITAL_COMMUNITY): Payer: Self-pay | Admitting: Emergency Medicine

## 2013-11-12 ENCOUNTER — Emergency Department (HOSPITAL_COMMUNITY)
Admission: EM | Admit: 2013-11-12 | Discharge: 2013-11-12 | Disposition: A | Discharge status: Home / Self Care | Payer: Medicaid Other | Attending: Emergency Medicine | Admitting: Emergency Medicine

## 2013-11-12 DIAGNOSIS — M545 Low back pain, unspecified: Secondary | ICD-10-CM

## 2013-11-12 DIAGNOSIS — F17200 Nicotine dependence, unspecified, uncomplicated: Secondary | ICD-10-CM | POA: Insufficient documentation

## 2013-11-12 LAB — URINALYSIS, ROUTINE W REFLEX MICROSCOPIC
Bilirubin Urine: NEGATIVE
GLUCOSE, UA: NEGATIVE mg/dL
Hgb urine dipstick: NEGATIVE
KETONES UR: NEGATIVE mg/dL
LEUKOCYTES UA: NEGATIVE
Nitrite: NEGATIVE
Protein, ur: NEGATIVE mg/dL
Specific Gravity, Urine: 1.023 (ref 1.005–1.030)
Urobilinogen, UA: 1 mg/dL (ref 0.0–1.0)
pH: 6 (ref 5.0–8.0)

## 2013-11-12 LAB — I-STAT CHEM 8, ED
BUN: 11 mg/dL (ref 6–23)
CALCIUM ION: 1.24 mmol/L — AB (ref 1.12–1.23)
CHLORIDE: 106 meq/L (ref 96–112)
Creatinine, Ser: 1 mg/dL (ref 0.50–1.35)
Glucose, Bld: 100 mg/dL — ABNORMAL HIGH (ref 70–99)
HCT: 40 % (ref 39.0–52.0)
Hemoglobin: 13.6 g/dL (ref 13.0–17.0)
Potassium: 3.7 mEq/L (ref 3.7–5.3)
SODIUM: 143 meq/L (ref 137–147)
TCO2: 23 mmol/L (ref 0–100)

## 2013-11-12 LAB — COMPREHENSIVE METABOLIC PANEL
ALK PHOS: 58 U/L (ref 39–117)
ALT: 23 U/L (ref 0–53)
AST: 16 U/L (ref 0–37)
Albumin: 4.2 g/dL (ref 3.5–5.2)
BUN: 12 mg/dL (ref 6–23)
CO2: 23 mEq/L (ref 19–32)
Calcium: 9.7 mg/dL (ref 8.4–10.5)
Chloride: 102 mEq/L (ref 96–112)
Creatinine, Ser: 0.86 mg/dL (ref 0.50–1.35)
GFR calc Af Amer: >90 mL/min (ref >90)
GFR calc non Af Amer: >90 mL/min (ref >90)
Glucose, Bld: 102 mg/dL — ABNORMAL HIGH (ref 70–99)
POTASSIUM: 4 meq/L (ref 3.7–5.3)
SODIUM: 141 meq/L (ref 137–147)
Total Bilirubin: 0.3 mg/dL (ref 0.3–1.2)
Total Protein: 8 g/dL (ref 6.0–8.3)

## 2013-11-12 LAB — CBC WITH DIFFERENTIAL/PLATELET
BASOS PCT: 0 % (ref 0–1)
Basophils Absolute: 0 10*3/uL (ref 0.0–0.1)
Eosinophils Absolute: 0.2 10*3/uL (ref 0.0–0.7)
Eosinophils Relative: 2 % (ref 0–5)
HCT: 39.9 % (ref 39.0–52.0)
Hemoglobin: 14.3 g/dL (ref 13.0–17.0)
Lymphocytes Relative: 46 % (ref 12–46)
Lymphs Abs: 4.3 10*3/uL — ABNORMAL HIGH (ref 0.7–4.0)
MCH: 30.8 pg (ref 26.0–34.0)
MCHC: 35.8 g/dL (ref 30.0–36.0)
MCV: 86 fL (ref 78.0–100.0)
MONOS PCT: 10 % (ref 3–12)
Monocytes Absolute: 1 10*3/uL (ref 0.1–1.0)
Neutro Abs: 3.9 10*3/uL (ref 1.7–7.7)
Neutrophils Relative %: 42 % — ABNORMAL LOW (ref 43–77)
PLATELETS: 317 10*3/uL (ref 150–400)
RBC: 4.64 MIL/uL (ref 4.22–5.81)
RDW: 13.2 % (ref 11.5–15.5)
WBC: 9.4 10*3/uL (ref 4.0–10.5)

## 2013-11-12 MED ORDER — OXYCODONE-ACETAMINOPHEN 5-325 MG PO TABS
2.0000 | ORAL_TABLET | Freq: Once | ORAL | Status: AC
  Administered 2013-11-12: 2 via ORAL
  Filled 2013-11-12: qty 2

## 2013-11-12 MED ORDER — METHOCARBAMOL 100 MG/ML IJ SOLN: 1000.0000 mg | Freq: Once | INTRAMUSCULAR | Status: DC

## 2013-11-12 MED ORDER — METHOCARBAMOL 500 MG PO TABS: 500.0000 mg | ORAL_TABLET | Freq: Two times a day (BID) | ORAL | Status: DC | PRN

## 2013-11-12 MED ORDER — NAPROXEN 500 MG PO TABS: 500.0000 mg | ORAL_TABLET | Freq: Two times a day (BID) | ORAL | Status: DC

## 2013-11-12 MED ORDER — METHOCARBAMOL 500 MG PO TABS
500.0000 mg | ORAL_TABLET | Freq: Once | ORAL | Status: AC
  Administered 2013-11-12: 500 mg via ORAL
  Filled 2013-11-12: qty 1

## 2013-11-12 MED ORDER — KETOROLAC TROMETHAMINE 60 MG/2ML IM SOLN
60.0000 mg | Freq: Once | INTRAMUSCULAR | Status: AC
  Administered 2013-11-12: 60 mg via INTRAMUSCULAR
  Filled 2013-11-12: qty 2

## 2013-11-12 MED ORDER — KETOROLAC TROMETHAMINE 60 MG/2ML IM SOLN: 60.0000 mg | Freq: Once | INTRAMUSCULAR | Status: DC

## 2013-11-12 NOTE — Discharge Instructions (Signed)
Take medications as directed. Recommend stretching and back exercises, attachment provided. Return to Emergency Department if you develop any signs of Fever, chills, numbness, weakness, difficulties urinating, or worsening pain despite treatment.    Back Pain, Adult Back pain is very common. The pain often gets better over time. The cause of back pain is usually not dangerous. Most people can learn to manage their back pain on their own.  HOME CARE   Stay active. Start with short walks on flat ground if you can. Try to walk farther each day.  Do not sit, drive, or stand in one place for more than 30 minutes. Do not stay in bed.  Do not avoid exercise or work. Activity can help your back heal faster.  Be careful when you bend or lift an object. Bend at your knees, keep the object close to you, and do not twist.  Sleep on a firm mattress. Lie on your side, and bend your knees. If you lie on your back, put a pillow under your knees.  Only take medicines as told by your doctor.  Put ice on the injured area.  Put ice in a plastic bag.  Place a towel between your skin and the bag.  Leave the ice on for 15-20 minutes, 03-04 times a day for the first 2 to 3 days. After that, you can switch between ice and heat packs.  Ask your doctor about back exercises or massage.  Avoid feeling anxious or stressed. Find good ways to deal with stress, such as exercise. GET HELP RIGHT AWAY IF:   Your pain does not go away with rest or medicine.  Your pain does not go away in 1 week.  You have new problems.  You do not feel well.  The pain spreads into your legs.  You cannot control when you poop (bowel movement) or pee (urinate).  Your arms or legs feel weak or lose feeling (numbness).  You feel sick to your stomach (nauseous) or throw up (vomit).  You have belly (abdominal) pain.  You feel like you may pass out (faint). MAKE SURE YOU:   Understand these instructions.  Will watch your  condition.  Will get help right away if you are not doing well or get worse. Document Released: 01/28/2008 Document Revised: 11/03/2011 Document Reviewed: 12/30/2010 [facility] Patient Information 2014 [place] [place].

## 2013-11-12 NOTE — ED Notes (Signed)
C/o bilateral flank pain since yesterday.  Denies urinary symptoms.  Pt [facility] and just left to come to ED due to pain.

## 2013-11-12 NOTE — ED Notes (Signed)
Unable to locate pt in triage waiting room.

## 2013-11-12 NOTE — ED Notes (Signed)
Patient unable to obtain urine spec at this time.  Will continue to monitor.

## 2013-11-12 NOTE — ED Provider Notes (Signed)
CSN: [id number]     Arrival date & time 11/12/13  0316 History   First MD Initiated Contact with Patient 11/12/13 0600     Chief Complaint  Patient presents with  . Flank Pain     (Consider location/radiation/quality/duration/timing/severity/associated sxs/prior Treatment) HPI 23 yo male presents today to ED with bilateral LBP that started yesterday around 3 pm. Patient states pain was gradual in onset. Pain is described as sharp "almost like a muscle pain, I dont know its in my lower back". Patient states pain only occurs when he moves. Pain is rated 8/10 at its worst but patient states "on this bed in this position, I'm all right" (patient laying comfortably in bed in NAD whed HOB elevated to 30 degrees). Denies fever/chills, weakness or numbness, radiation of pain down legs, urinary sxs, or hx of cancer. Patient does admit to recent IVDU 2 days ago. Patient states he uses heroin.  Denies CP, Shortness of breath, HA, neck pain, abdominal pain, or n/v.  Past Medical History  Diagnosis Date  . Heroin addiction    Past Surgical History  Procedure Laterality Date  . Knee arthroscopy    . Shoulder arthroscopy     No family history on file. History  Substance Use Topics  . Smoking status: Current Some Day Smoker -- 0.50 packs/day    Types: Cigarettes  . Smokeless tobacco: Not on file  . Alcohol Use: No    Review of Systems    Allergies  Other  Home Medications   Current Outpatient Rx  Name  Route  Sig  Dispense  Refill  . methocarbamol (ROBAXIN) 500 MG tablet   Oral   Take 1 tablet (500 mg total) by mouth 2 (two) times daily as needed for muscle spasms.   20 tablet   0   . naproxen (NAPROSYN) 500 MG tablet   Oral   Take 1 tablet (500 mg total) by mouth 2 (two) times daily.   30 tablet   0    BP 115/57  Pulse 86  Temp(Src) 98 F (36.7 C) (Oral)  Resp 18  Ht 6\' 2"  (1.88 m)  Wt 165 lb (74.844 kg)  BMI 21.18 kg/m2  SpO2 98% Physical Exam  Nursing note and  vitals reviewed. Constitutional: He is oriented to person, place, and time. He appears well-developed and well-nourished. No distress.  HENT:  Head: Normocephalic and atraumatic.  Mouth/Throat: Uvula is midline, oropharynx is clear and moist and mucous membranes are normal.  Eyes: Conjunctivae and EOM are normal. Pupils are equal, round, and reactive to light. No scleral icterus.  Neck: Trachea normal, normal range of motion, full passive range of motion without pain and phonation normal. Neck supple. No JVD present. No spinous process tenderness and no muscular tenderness present. Carotid bruit is not present. No rigidity. No tracheal deviation, no edema, no erythema and normal range of motion present.  No meningeal signs noted on exam   Cardiovascular: Normal rate and regular rhythm.  Exam reveals no gallop and no friction rub.   No murmur heard. Pulmonary/Chest: Effort normal and breath sounds normal. No respiratory distress. He has no wheezes. He has no rhonchi. He has no rales.  Musculoskeletal: Normal range of motion. He exhibits no edema.  No midlines spinal tenderness.  Paraspinal tenderness and tightness noted in lumbar region.  SLR negative bilaterally.  Patient pain reproduced when asked to touch his toes. Patient describes sharp tight pain in bilateral low back.   Neurological: He is alert  and oriented to person, place, and time. He has normal strength. No cranial nerve deficit or sensory deficit.  CN II-XII grossly intact. Cerebellar function appears intact with finger to nose.   Skin: Skin is warm and dry. He is not diaphoretic.  Psychiatric: He has a normal mood and affect. His behavior is normal.    ED Course  Procedures (including critical care time) Labs Review Labs Reviewed  CBC WITH DIFFERENTIAL - Abnormal; Notable for the following:    Neutrophils Relative % 42 (*)    Lymphs Abs 4.3 (*)    All other components within normal limits  COMPREHENSIVE METABOLIC PANEL -  Abnormal; Notable for the following:    Glucose, Bld 102 (*)    All other components within normal limits  I-STAT CHEM 8, ED - Abnormal; Notable for the following:    Glucose, Bld 100 (*)    Calcium, Ion 1.24 (*)    All other components within normal limits  URINALYSIS, ROUTINE W REFLEX MICROSCOPIC   Imaging Review No results found.   EKG Interpretation None      MDM   Final diagnoses:  LBP (low back pain)  VS WNL.  The patient has no upper back or neck pain, no fever, no hx of cancer, recent spinal procedures, weakness or numbness of the lower extremities and no urinary complaints including no retention or incontinence. Patient does admit to IVDU, though patient afebrile without evidence of Leukocytosis. Doubt infectious etiology. Doubt Spinal abscess.  UA WNL, no evidence of hematuria. Doubt kidney stones or pyelonephritis.  Patient low risk for AAA or dissection.  CMP WNL  CBC WNL   Patient's pain improved upon reexamination. Neurological exam benign with no focal deficits. Patient able to ambulate in room without difficulty. Plan to tx patient's symptoms. Patient currently receiving treatment for drug abuse. Patient advised to return to ED should any of his symptoms worsen or fail to improve with tx.   Patient discussed with Dr. Eber HongBrian Miller.   Meds given in ED:  Medications  oxyCODONE-acetaminophen (PERCOCET/ROXICET) 5-325 MG per tablet 2 tablet (2 tablets Oral Given 11/12/13 0641)  ketorolac (TORADOL) injection 60 mg (60 mg Intramuscular Given 11/12/13 0755)  methocarbamol (ROBAXIN) tablet 500 mg (500 mg Oral Given 11/12/13 0641)    New Prescriptions   METHOCARBAMOL (ROBAXIN) 500 MG TABLET    Take 1 tablet (500 mg total) by mouth 2 (two) times daily as needed for muscle spasms.   NAPROXEN (NAPROSYN) 500 MG TABLET    Take 1 tablet (500 mg total) by mouth 2 (two) times daily.         Rudene AndaJacob Gray Jakevion Arney, PA-C 11/13/13 2324

## 2013-11-14 NOTE — ED Provider Notes (Signed)
Medical screening examination/treatment/procedure(s) were performed by non-physician practitioner and as supervising physician I was immediately available for consultation/collaboration.  Gavin PoundMichael Y. Oletta LamasGhim, MD 11/14/13 [phone]

## 2013-11-15 NOTE — ED Provider Notes (Signed)
Medical screening examination/treatment/procedure(s) were performed by non-physician practitioner and as supervising physician I was immediately available for consultation/collaboration.    Vida RollerBrian D Judee Hennick, MD 11/15/13 1520

## 2014-03-06 ENCOUNTER — Emergency Department (HOSPITAL_COMMUNITY)
Admission: EM | Admit: 2014-03-06 | Discharge: 2014-03-07 | Discharge status: Against Medical Advice | Payer: Medicaid Other | Attending: Emergency Medicine | Admitting: Emergency Medicine

## 2014-03-06 ENCOUNTER — Encounter (HOSPITAL_COMMUNITY): Payer: Self-pay | Admitting: Emergency Medicine

## 2014-03-06 DIAGNOSIS — F17200 Nicotine dependence, unspecified, uncomplicated: Secondary | ICD-10-CM | POA: Insufficient documentation

## 2014-03-06 DIAGNOSIS — F1410 Cocaine abuse, uncomplicated: Secondary | ICD-10-CM | POA: Insufficient documentation

## 2014-03-06 DIAGNOSIS — F1310 Sedative, hypnotic or anxiolytic abuse, uncomplicated: Secondary | ICD-10-CM | POA: Insufficient documentation

## 2014-03-06 DIAGNOSIS — F1110 Opioid abuse, uncomplicated: Secondary | ICD-10-CM | POA: Insufficient documentation

## 2014-03-06 NOTE — ED Notes (Addendum)
Pt states that he needs medical clearance for [facility] for heroin, cocaine and xanax detox. Mobile Crisis team member with him at present. Pt does have bed ready at [facility]. Denies SI/HI.

## 2014-03-07 LAB — COMPREHENSIVE METABOLIC PANEL
ALK PHOS: 61 U/L (ref 39–117)
ALT: 26 U/L (ref 0–53)
ANION GAP: 14 (ref 5–15)
AST: 29 U/L (ref 0–37)
Albumin: 4.2 g/dL (ref 3.5–5.2)
BILIRUBIN TOTAL: 0.3 mg/dL (ref 0.3–1.2)
BUN: 14 mg/dL (ref 6–23)
CO2: 25 meq/L (ref 19–32)
CREATININE: 0.87 mg/dL (ref 0.50–1.35)
Calcium: 9.7 mg/dL (ref 8.4–10.5)
Chloride: 98 mEq/L (ref 96–112)
GFR calc Af Amer: >90 mL/min (ref >90)
GFR calc non Af Amer: >90 mL/min (ref >90)
Glucose, Bld: 98 mg/dL (ref 70–99)
Potassium: 4.1 mEq/L (ref 3.7–5.3)
Sodium: 137 mEq/L (ref 137–147)
Total Protein: 8.2 g/dL (ref 6.0–8.3)

## 2014-03-07 LAB — ETHANOL

## 2014-03-07 LAB — RAPID URINE DRUG SCREEN, HOSP PERFORMED
Amphetamines: NOT DETECTED
BARBITURATES: NOT DETECTED
BENZODIAZEPINES: POSITIVE — AB
Cocaine: POSITIVE — AB
Opiates: POSITIVE — AB
Tetrahydrocannabinol: NOT DETECTED

## 2014-03-07 LAB — CBC
HEMATOCRIT: 41 % (ref 39.0–52.0)
HEMOGLOBIN: 14.2 g/dL (ref 13.0–17.0)
MCH: 30.2 pg (ref 26.0–34.0)
MCHC: 34.6 g/dL (ref 30.0–36.0)
MCV: 87.2 fL (ref 78.0–100.0)
Platelets: 273 10*3/uL (ref 150–400)
RBC: 4.7 MIL/uL (ref 4.22–5.81)
RDW: 13.2 % (ref 11.5–15.5)
WBC: 8.5 10*3/uL (ref 4.0–10.5)

## 2014-03-07 LAB — ACETAMINOPHEN LEVEL

## 2014-03-07 LAB — SALICYLATE LEVEL: Salicylate Lvl: <2 mg/dL — ABNORMAL LOW (ref 2.8–20.0)

## 2014-03-07 NOTE — ED Notes (Signed)
[facility] is waiting on a call from his Mobile Crisis person because she wants them to be clear that they do not do heroin detox, RN will fax his lab results and wait to hear from counselor
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
## 2014-03-07 NOTE — ED Provider Notes (Signed)
Medical screening examination/treatment/procedure(s) were performed by non-physician practitioner and as supervising physician I was immediately available for consultation/collaboration.   EKG Interpretation None        Noga Fogg, MD 03/07/14 0440 
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
## 2014-03-07 NOTE — ED Notes (Signed)
Results faxed to [facility], they will call back after they review them. Pt is coming out to the desk and demanding and cussing at the staff. [facility] aware of patients attitude.

## 2014-04-04 ENCOUNTER — Encounter (HOSPITAL_COMMUNITY): Payer: Self-pay | Admitting: Emergency Medicine

## 2014-04-04 ENCOUNTER — Emergency Department (HOSPITAL_COMMUNITY)
Admission: EM | Admit: 2014-04-04 | Discharge: 2014-04-04 | Disposition: A | Discharge status: Home / Self Care | Payer: Medicaid Other | Attending: Emergency Medicine | Admitting: Emergency Medicine

## 2014-04-04 DIAGNOSIS — Z008 Encounter for other general examination: Secondary | ICD-10-CM | POA: Insufficient documentation

## 2014-04-04 DIAGNOSIS — F17200 Nicotine dependence, unspecified, uncomplicated: Secondary | ICD-10-CM | POA: Insufficient documentation

## 2014-04-04 DIAGNOSIS — F1110 Opioid abuse, uncomplicated: Secondary | ICD-10-CM

## 2014-04-04 DIAGNOSIS — F1410 Cocaine abuse, uncomplicated: Secondary | ICD-10-CM | POA: Insufficient documentation

## 2014-04-04 DIAGNOSIS — F1490 Cocaine use, unspecified, uncomplicated: Secondary | ICD-10-CM

## 2014-04-04 DIAGNOSIS — F1120 Opioid dependence, uncomplicated: Secondary | ICD-10-CM | POA: Insufficient documentation

## 2014-04-04 LAB — RAPID URINE DRUG SCREEN, HOSP PERFORMED
Amphetamines: NOT DETECTED
Barbiturates: NOT DETECTED
Benzodiazepines: NOT DETECTED
Cocaine: POSITIVE — AB
Opiates: POSITIVE — AB
Tetrahydrocannabinol: NOT DETECTED

## 2014-04-04 LAB — COMPREHENSIVE METABOLIC PANEL
ALT: 40 U/L (ref 0–53)
AST: 28 U/L (ref 0–37)
Albumin: 4.2 g/dL (ref 3.5–5.2)
Alkaline Phosphatase: 54 U/L (ref 39–117)
Anion gap: 10 (ref 5–15)
BUN: 22 mg/dL (ref 6–23)
CO2: 26 mEq/L (ref 19–32)
Calcium: 9.7 mg/dL (ref 8.4–10.5)
Chloride: 104 mEq/L (ref 96–112)
Creatinine, Ser: 0.84 mg/dL (ref 0.50–1.35)
GFR calc Af Amer: >90 mL/min (ref >90)
GFR calc non Af Amer: >90 mL/min (ref >90)
Glucose, Bld: 90 mg/dL (ref 70–99)
Potassium: 4.8 mEq/L (ref 3.7–5.3)
Sodium: 140 mEq/L (ref 137–147)
Total Bilirubin: 0.2 mg/dL — ABNORMAL LOW (ref 0.3–1.2)
Total Protein: 8 g/dL (ref 6.0–8.3)

## 2014-04-04 LAB — CBC
HCT: 42.2 % (ref 39.0–52.0)
Hemoglobin: 14.1 g/dL (ref 13.0–17.0)
MCH: 29.9 pg (ref 26.0–34.0)
MCHC: 33.4 g/dL (ref 30.0–36.0)
MCV: 89.4 fL (ref 78.0–100.0)
Platelets: 265 10*3/uL (ref 150–400)
RBC: 4.72 MIL/uL (ref 4.22–5.81)
RDW: 13.7 % (ref 11.5–15.5)
WBC: 7.1 10*3/uL (ref 4.0–10.5)

## 2014-04-04 LAB — ACETAMINOPHEN LEVEL: Acetaminophen (Tylenol), Serum: <15 ug/mL (ref 10–30)

## 2014-04-04 LAB — ETHANOL: Alcohol, Ethyl (B): <11 mg/dL (ref 0–11)

## 2014-04-04 LAB — SALICYLATE LEVEL: Salicylate Lvl: <2 mg/dL — ABNORMAL LOW (ref 2.8–20.0)

## 2014-04-04 MED ORDER — IBUPROFEN 400 MG PO TABS: 600.0000 mg | ORAL_TABLET | Freq: Three times a day (TID) | ORAL | Status: DC | PRN

## 2014-04-04 MED ORDER — ALUM & MAG HYDROXIDE-SIMETH 200-200-20 MG/5ML PO SUSP: 30.0000 mL | ORAL | Status: DC | PRN

## 2014-04-04 MED ORDER — ONDANSETRON HCL 4 MG PO TABS: 4.0000 mg | ORAL_TABLET | Freq: Three times a day (TID) | ORAL | Status: DC | PRN

## 2014-04-04 MED ORDER — ACETAMINOPHEN 325 MG PO TABS: 650.0000 mg | ORAL_TABLET | ORAL | Status: DC | PRN

## 2014-04-04 MED ORDER — LORAZEPAM 1 MG PO TABS: 1.0000 mg | ORAL_TABLET | Freq: Three times a day (TID) | ORAL | Status: DC | PRN

## 2014-04-04 MED ORDER — NICOTINE 21 MG/24HR TD PT24: 21.0000 mg | MEDICATED_PATCH | Freq: Every day | TRANSDERMAL | Status: DC

## 2014-04-04 MED ORDER — ZOLPIDEM TARTRATE 5 MG PO TABS: 5.0000 mg | ORAL_TABLET | Freq: Every evening | ORAL | Status: DC | PRN

## 2014-04-04 NOTE — BH Assessment (Signed)
Tele Assessment Note   James Crane is an 23 y.o. male. Writer spoke w/ James FinnerErin O'Malley PA-C re: pt's presentation. [facility] from heroin. Pt sts he [facility] [place]. He says [facility] but was asked to leave d/t open wounds.  [facility].  UDS was + for opiates and cocaine. Pt reports injecting approx. $40 heroin daily for past several mos. He reports smoking crack cocaine 2-3 times weekly and last smoked $20 crack 04/03/14. Pt denies hx of seizures when withdrawing. Pt [facility] and sts [facility] few mos [facility]. He denies SI and HI. Pt denies [facility] and no delusions noted. Pt sts last used heroin, approx. $40, at 9 am today. Pt is calm and cooperative. He is oriented x 4 and his thought process is organized. Pt denies current withdrawal sxs. Per [place] [address] Calendar site, pt's next court date is 04/11/14 for speeding, expired license and drug possession. When asked re: pt's current motivation to start treatment, pt says, "I need to stop. That's it." Pt reports severe anxiety. He endorses isolating and loss of interest in usual activities. Writer ran pt by Claudette Headonrad Withrow NP who recommends inpatient heroin detox for pt at [facility], [facility] or [facility]. [facility] does not do opioid detox so [facility].  Axis I: Opioid Use Disorder, Severe            Cocaine Use Disorder, Severe Axis II: Deferred Axis III:  Past Medical History  Diagnosis Date  . Heroin addiction    Axis IV: economic problems, housing problems, other psychosocial or environmental problems, problems related to legal system/crime and problems related to social environment Axis V: 41-50 serious symptoms  Past Medical History:  Past Medical History  Diagnosis Date  . Heroin  addiction     Past Surgical History  Procedure Laterality Date  . Knee arthroscopy    . Shoulder arthroscopy      Family History: History reviewed. No pertinent family history.  Social History:  reports that he has been smoking Cigarettes.  He has been smoking about 0.50 packs per day. He does not have any smokeless tobacco history on file. He reports that he uses illicit drugs (Marijuana, Cocaine, and Heroin). He reports that he does not drink alcohol.  Additional Social History:  Alcohol / Drug Use Pain Medications: pt denies abuse Prescriptions: pt denies abuse Over the Counter: pt denies abuse History of alcohol / drug use?: Yes Negative Consequences of Use: Financial;Legal Withdrawal Symptoms:  (none) Substance #1 Name of Substance 1: heroin - by injection 1 - Age of First Use: 23 yo 1 - Amount (size/oz): $40 1 - Frequency: daily 1 - Duration: for past several mos 1 - Last Use / Amount: 04/04/14 - $20 Substance #2 Name of Substance 2: crack cocaine 2 - Age of First Use: 21 2 - Amount (size/oz): $40 2 - Frequency: 2-3 times weekly 2 - Duration: for past several mos 2 - Last Use / Amount: 04/03/14- - $20  CIWA: CIWA-Ar BP: 114/59 mmHg Pulse Rate: 68 COWS: Clinical Opiate Withdrawal Scale (COWS) Resting Pulse Rate: Pulse Rate 80 or below Sweating: No report of chills or flushing Restlessness: Able to sit still Pupil Size: Pupils pinned or  normal size for room light Bone or Joint Aches: Not present Runny Nose or Tearing: Not present GI Upset: No GI symptoms Tremor: No tremor Yawning: No yawning Anxiety or Irritability: None Gooseflesh Skin: Skin is smooth COWS Total Score: 0  PATIENT STRENGTHS: (choose at least two)   Allergies:  Allergies  Allergen Reactions  . Other Nausea And Vomiting    "Anesthesia"- unknown specific medication name    Home Medications:  (Not in a hospital admission)  OB/GYN Status:  No LMP for male patient.  General Assessment  Data Location of Assessment: [facility] ED Is this a Tele or Face-to-Face Assessment?: Tele Assessment Is this an Initial Assessment or a Re-assessment for this encounter?: Initial Assessment Living Arrangements: Other (Comment) (living "couch to couch") Can pt return to current living arrangement?: Yes Admission Status: Voluntary Is patient capable of signing voluntary admission?: Yes Transfer from: Home Referral Source: Self/Family/Friend     [facility] Crisis Care Plan Living Arrangements: Other (Comment) (living "couch to couch") Name of Psychiatrist: none Name of Therapist: none  Education Status Is patient currently in school?: No Highest grade of school patient has completed: GED  Risk to self with the past 6 months Suicidal Ideation: No Suicidal Intent: No Is patient at risk for suicide?: No Suicidal Plan?: No Access to Means: No What has been your use of drugs/alcohol within the last 12 months?: daily heroin use, cocaine 2-3 times weekly Previous Attempts/Gestures: No How many times?: 0 Other Self Harm Risks: none Triggers for Past Attempts:  (n/a) Intentional Self Injurious Behavior: None Family Suicide History: No Recent stressful life event(s): Other (Comment) (substance abuse) Persecutory voices/beliefs?: No Depression: Yes Depression Symptoms: Isolating;Loss of interest in usual pleasures Substance abuse history and/or treatment for substance abuse?: Yes Suicide prevention information given to non-admitted patients: Not applicable  Risk to Others within the past 6 months Homicidal Ideation: No-Not Currently/Within Last 6 Months Thoughts of Harm to Others: No Current Homicidal Intent: No Current Homicidal Plan: No Access to Homicidal Means: No Identified Victim: none History of harm to others?: No Assessment of Violence: None Noted Violent Behavior Description: pt denies hx violence - is calm during assessment Does patient have access to weapons?: No Criminal  Charges Pending?: Yes Describe Pending Criminal Charges: speeding, drug possession, revoked license Does patient have a court date: Yes Court Date: 04/11/14  Psychosis Hallucinations: None noted Delusions: None noted  Mental Status Report Appear/Hygiene: In scrubs;Unremarkable Eye Contact: Good Motor Activity: Freedom of movement Speech: Logical/coherent Level of Consciousness: Alert Mood: Euthymic Affect: Appropriate to circumstance;Depressed Anxiety Level: Severe Thought Processes: Relevant;Coherent Judgement: Unimpaired Orientation: Person;Place;Time;Situation Obsessive Compulsive Thoughts/Behaviors: None  Cognitive Functioning Concentration: Normal Memory: Recent Intact;Remote Intact IQ: Average Insight: Poor Impulse Control: Poor Appetite: Poor Weight Loss: 20 (sts has lost 20 lbs in 2 yrs) Sleep: No Change Total Hours of Sleep:  (ranges from 5 hrs to 12 hrs) Vegetative Symptoms: None  ADLScreening [facility]) Patient's cognitive ability adequate to safely complete daily activities?: Yes Patient able to express need for assistance with ADLs?: Yes Independently performs ADLs?: Yes (appropriate for developmental age)  Prior Inpatient Therapy Prior Inpatient Therapy: Yes Prior Therapy Dates: 2015 & 2014 Prior Therapy Facilty/Provider(s): [facility] Reason for Treatment: substance abuse detox and treatment  Prior Outpatient Therapy Prior Outpatient Therapy: No Prior Therapy Dates: na Prior Therapy Facilty/Provider(s): na Reason for Treatment: na  ADL Screening (condition at time of admission) Patient's cognitive ability adequate to safely complete daily activities?: Yes Is  the patient deaf or have difficulty hearing?: No Does the patient have difficulty seeing, even when wearing glasses/contacts?: No Does the patient have difficulty concentrating, remembering, or making decisions?: No Patient able to express need for assistance  with ADLs?: Yes Does the patient have difficulty dressing or bathing?: No Independently performs ADLs?: Yes (appropriate for developmental age) Does the patient have difficulty walking or climbing stairs?: No Weakness of Legs: None Weakness of Arms/Hands: None  Home Assistive Devices/Equipment Home Assistive Devices/Equipment: None    Abuse/Neglect Assessment (Assessment to be complete while patient is alone) Physical Abuse: Denies Verbal Abuse: Denies Sexual Abuse: Denies Exploitation of patient/patient's resources: Denies Self-Neglect: Denies Values / Beliefs Cultural Requests During Hospitalization: None Spiritual Requests During Hospitalization: None   Advance Directives (For Healthcare) Advance Directive: Patient does not have advance directive    Additional Information 1:1 In Past 12 Months?: No CIRT Risk: No Elopement Risk: No Does patient have medical clearance?: Yes     Disposition:  Disposition Initial Assessment Completed for this Encounter: Yes Disposition of Patient: Outpatient treatment;Other dispositions;Referred to Other disposition(s):  ([facility], [facility])  Wilson Dusenbery P 04/04/2014 3:14 PM

## 2014-04-04 NOTE — ED Notes (Signed)
Attempted report, Drinda Buttsnnette to call.

## 2014-04-04 NOTE — Discharge Instructions (Signed)
Opioid Withdrawal  Opioids are a group of narcotic drugs. They include the street drug heroin. They also include pain medicines, such as morphine, hydrocodone, oxycodone, and fentanyl. Opioid withdrawal is a group of characteristic physical and mental signs and symptoms. It typically occurs if you have been using opioids daily for several weeks or longer and stop using or rapidly decrease use. Opioid withdrawal can also occur if you have used opioids daily for a long time and are given a medicine to block the effect.   SIGNS AND SYMPTOMS  Opioid withdrawal includes three or more of the following symptoms:   · Depressed, anxious, or irritable mood.  · Nausea or vomiting.  · Muscle aches or spasms.    · Watery eyes.     · Runny nose.  · Dilated pupils, sweating, or hairs standing on end.  · Diarrhea or intestinal cramping.  · Yawning.    · Fever.  · Increased blood pressure.  · Fast pulse.  · Restlessness or trouble sleeping.  These signs and symptoms occur within several hours of stopping or reducing short-acting opioids, such as heroin. They can occur within 3 days of stopping or reducing long-acting opioids, such as methadone. Withdrawal begins within minutes of receiving a drug that blocks the effects of opioids, such as naltrexone or naloxone.  DIAGNOSIS   Opioid use disorder is diagnosed by your health care provider. You will be asked about your symptoms, drug and alcohol use, medical history, and use of medicines. A physical exam may be done. Lab tests may be ordered. Your health care provider may have you see a mental health professional.   TREATMENT   The treatment for opioid withdrawal is usually provided by medical doctors with special training in substance use disorders (addiction specialists). The following medicines may be included in treatment:  · Opioids given in place of the abused opioid. They turn on opioid receptors in the brain and lessen or prevent withdrawal symptoms. They are gradually  decreased (opioid substitution and taper).  · Non-opioids that can lessen certain opioid withdrawal symptoms. They may be used alone or with opioid substitution and taper.  Successful long-term recovery usually requires medicine, counseling, and group support.  HOME CARE INSTRUCTIONS   · Take medicines only as directed by your health care provider.  · Check with your health care provider before starting new medicines.  · Keep all follow-up visits as directed by your health care provider.  SEEK MEDICAL CARE IF:  · You are not able to take your medicines as directed.  · Your symptoms get worse.  · You relapse.  SEEK IMMEDIATE MEDICAL CARE IF:  · You have serious thoughts about hurting yourself or others.  · You have a seizure.  · You lose consciousness.  Document Released: 08/14/2003 Document Revised: 12/26/2013 Document Reviewed: 08/24/2013  ExitCare® Patient Information ©2015 [employer]. This information is not intended to replace advice given to you by your health care provider. Make sure you discuss any questions you have with your health care provider.

## 2014-04-04 NOTE — ED Notes (Signed)
Security to wand patient 

## 2014-04-04 NOTE — ED Notes (Signed)
Called [facility] and they are just receiving pt referral due to fax difficulties.

## 2014-04-04 NOTE — ED Notes (Signed)
PATIENT HERE REQUESTING DETOX FROM OPIATES AND COCAINE. STATES HE HAS BEEN USING BOTH FOR 2-3 YEARS AND HAS ONLY HAD ABOUT A MONTH OF SOBRIETY IN THE PAST YEAR. WHEN ASKED WHY HE IS TRYING TO QUIT PT STATES "I DONT KNOW, WHAT DOES ANY ADDICT SAY WHEN YOU ASK THEM THAT". HE ADMITS TO NUMEROUS COURT DATES APPROACHING. STATES "I DONT KNOW WHEN OR WHAT THEY ARE FOR".  HE STATES HE LAST USED HEROIN THIS MORNING AROUND 9 AM. STATES LAST SMOKED CRACK YESTERDAY. STATES HE HAS A 30-40 DOLLAR A DAY HEROIN HABIT AND SMOKES CRACK 2-3 TIMES A week. . States he went out to [facility] but they sent him here because he needs to detox. States the last time he tried to detox was "a few months ago" states he went [facility] and only stayed for detox

## 2014-04-04 NOTE — [facility] Counselor (Signed)
Writer spoke w/ Denny PeonErin PA-C re: pt's presentation. Writer then left voicemail [facility]rey [facility]Jeff [facility] [phone] to find out when pt could begin residential program. Per pt, [facility]. Writer will do TA when teleassessment machine avaiable. Pt's RN to Regulatory affairs officercall writer back.   Evette Cristalaroline Paige Bradon Fester, [place] Assessment Counselor

## 2014-04-04 NOTE — ED Notes (Signed)
[facility] has been accepted to [facility]

## 2014-04-04 NOTE — ED Provider Notes (Signed)
CSN: [id number]     Arrival date & time 04/04/14  1221 History   First MD Initiated Contact with Patient 04/04/14 1231     Chief Complaint  Patient presents with  . Drug Problem  . Medical Clearance     (Consider location/radiation/quality/duration/timing/severity/associated sxs/prior Treatment) Patient is a 23 y.o. male presenting with drug problem.  Drug Problem Pertinent negatives include no abdominal pain, chest pain, chills, coughing, fever, headaches, nausea, vomiting or weakness.  Pt is a 23yo male with hx of heroin addiction presenting to ED for detox from heroine stating he went to [facility] for treatment but was advised he had to come to ED for detox.  States he last used heroine around 9am this morning, and cocaine yesterday. Denies use of alcohol. Denies hx of seizures. Denies SI or HI. Denies psychiatric hx including bipolar disorder, schizophrenia, or anxiety/depression. Denies chest pain, SOB, abdominal pain, nausea, or any other complaints at this time.   Past Medical History  Diagnosis Date  . Heroin addiction    Past Surgical History  Procedure Laterality Date  . Knee arthroscopy    . Shoulder arthroscopy     History reviewed. No pertinent family history. History  Substance Use Topics  . Smoking status: Current Some Day Smoker -- 0.50 packs/day    Types: Cigarettes  . Smokeless tobacco: Not on file  . Alcohol Use: No    Review of Systems  Constitutional: Negative for fever, chills and appetite change.  Respiratory: Negative for cough and shortness of breath.   Cardiovascular: Negative for chest pain and palpitations.  Gastrointestinal: Negative for nausea, vomiting, abdominal pain and diarrhea.  Neurological: Negative for dizziness, seizures, weakness, light-headedness and headaches.  Psychiatric/Behavioral: Negative for suicidal ideas, hallucinations, behavioral problems, sleep disturbance and self-injury. The patient is not nervous/anxious.   All other  systems reviewed and are negative.     Allergies  Other  Home Medications   Prior to Admission medications   Not on File   BP 102/59  Pulse 62  Temp(Src) 97.9 F (36.6 C) (Oral)  Resp 20  Ht 6\' 2"  (1.88 m)  Wt 170 lb (77.111 kg)  BMI 21.82 kg/m2  SpO2 100% Physical Exam  Nursing note and vitals reviewed. Constitutional: He is oriented to person, place, and time. He appears well-developed and well-nourished.  HENT:  Head: Normocephalic and atraumatic.  Eyes: Conjunctivae are normal. No scleral icterus.  Neck: Normal range of motion.  Cardiovascular: Normal rate, regular rhythm and normal heart sounds.   Pulmonary/Chest: Effort normal and breath sounds normal. No respiratory distress. He has no wheezes. He has no rales. He exhibits no tenderness.  Abdominal: Soft. Bowel sounds are normal. He exhibits no distension and no mass. There is no tenderness. There is no rebound and no guarding.  Musculoskeletal: Normal range of motion.  Neurological: He is alert and oriented to person, place, and time. No cranial nerve deficit.  Skin: Skin is warm and dry.  Psychiatric: He has a normal mood and affect. His speech is normal and behavior is normal. Thought content normal. He is not actively hallucinating. He expresses no homicidal and no suicidal ideation.    ED Course  Procedures (including critical care time) Labs Review Labs Reviewed  COMPREHENSIVE METABOLIC PANEL - Abnormal; Notable for the following:    Total Bilirubin 0.2 (*)    All other components within normal limits  SALICYLATE LEVEL - Abnormal; Notable for the following:    Salicylate Lvl <2.0 (*)  All other components within normal limits  URINE RAPID DRUG SCREEN (HOSP PERFORMED) - Abnormal; Notable for the following:    Opiates POSITIVE (*)    Cocaine POSITIVE (*)    All other components within normal limits  ACETAMINOPHEN LEVEL  CBC  ETHANOL    Imaging Review No results found.   EKG  Interpretation None      MDM   Final diagnoses:  Heroin abuse  Cocaine use    Pt is a calm and cooperative. [facility] recent use.  Reports last using heroine today and cocaine yesterday. Denies use of Etoh, or hx of seizures. Denies SI/HI.  Pt is medically cleared to be evaluated by TTS for further disposition.    1:38 PM Consulted with Behavioral health who stated they will call to determine if [facility] has available beds, will also perform telepsych.    17:15 PM Per note from Lynnette CaffeyAnnett Andrews, RN, pt has been accepted to [facility] and will be transported for tx of heroine addiction.    James Finnerrin O'Malley, PA-C 04/04/14 [phone]

## 2014-04-04 NOTE — ED Notes (Signed)
[facility]

## 2014-04-04 NOTE — ED Notes (Signed)
Pt requesting detox from heroine, last used today. Denies any SI or HI.

## 2014-04-04 NOTE — Progress Notes (Signed)
[facility] Community Liaison Stacy, ° °Will send patient a list of primary care resources and information on [facility] Card program to help patient establish primary care.  °

## 2014-04-04 NOTE — ED Notes (Signed)
[facility] CALLED TO TRANSPORT 

## 2014-04-04 NOTE — ED Notes (Signed)
CALLED [facility] AND THEY ARE AWARE PT IS ENROUTE

## 2014-04-05 NOTE — ED Provider Notes (Signed)
Medical screening examination/treatment/procedure(s) were performed by non-physician practitioner and as supervising physician I was immediately available for consultation/collaboration.   EKG Interpretation None        Elwin MochaBlair Lalena Salas, MD 04/05/14 1659

## 2014-06-07 ENCOUNTER — Emergency Department (HOSPITAL_COMMUNITY)
Admission: EM | Admit: 2014-06-07 | Discharge: 2014-06-07 | Disposition: A | Discharge status: Home / Self Care | Payer: Medicaid Other | Attending: Emergency Medicine | Admitting: Emergency Medicine

## 2014-06-07 ENCOUNTER — Encounter (HOSPITAL_COMMUNITY): Payer: Self-pay | Admitting: Emergency Medicine

## 2014-06-07 ENCOUNTER — Emergency Department (HOSPITAL_COMMUNITY): Payer: Medicaid Other

## 2014-06-07 DIAGNOSIS — R4182 Altered mental status, unspecified: Secondary | ICD-10-CM

## 2014-06-07 DIAGNOSIS — Z720 Tobacco use: Secondary | ICD-10-CM | POA: Insufficient documentation

## 2014-06-07 LAB — SALICYLATE LEVEL

## 2014-06-07 LAB — COMPREHENSIVE METABOLIC PANEL
ALK PHOS: 55 U/L (ref 39–117)
ALT: 24 U/L (ref 0–53)
AST: 27 U/L (ref 0–37)
Albumin: 4 g/dL (ref 3.5–5.2)
Anion gap: 13 (ref 5–15)
BUN: 17 mg/dL (ref 6–23)
CALCIUM: 9.5 mg/dL (ref 8.4–10.5)
CO2: 26 meq/L (ref 19–32)
Chloride: 102 mEq/L (ref 96–112)
Creatinine, Ser: 0.97 mg/dL (ref 0.50–1.35)
GLUCOSE: 95 mg/dL (ref 70–99)
POTASSIUM: 4.2 meq/L (ref 3.7–5.3)
Sodium: 141 mEq/L (ref 137–147)
TOTAL PROTEIN: 7.8 g/dL (ref 6.0–8.3)
Total Bilirubin: 0.2 mg/dL — ABNORMAL LOW (ref 0.3–1.2)

## 2014-06-07 LAB — CBC
HCT: 36.1 % — ABNORMAL LOW (ref 39.0–52.0)
HEMOGLOBIN: 12.3 g/dL — AB (ref 13.0–17.0)
MCH: 29.6 pg (ref 26.0–34.0)
MCHC: 34.1 g/dL (ref 30.0–36.0)
MCV: 87 fL (ref 78.0–100.0)
PLATELETS: 276 10*3/uL (ref 150–400)
RBC: 4.15 MIL/uL — AB (ref 4.22–5.81)
RDW: 13.3 % (ref 11.5–15.5)
WBC: 8.5 10*3/uL (ref 4.0–10.5)

## 2014-06-07 LAB — ACETAMINOPHEN LEVEL

## 2014-06-07 LAB — RAPID URINE DRUG SCREEN, HOSP PERFORMED
Amphetamines: NOT DETECTED
BENZODIAZEPINES: POSITIVE — AB
Barbiturates: NOT DETECTED
Cocaine: POSITIVE — AB
OPIATES: POSITIVE — AB
Tetrahydrocannabinol: NOT DETECTED

## 2014-06-07 LAB — ETHANOL: Alcohol, Ethyl (B): <11 mg/dL (ref 0–11)

## 2014-06-07 MED ORDER — NALOXONE HCL 0.4 MG/ML IJ SOLN
INTRAMUSCULAR | Status: AC
  Filled 2014-06-07: qty 1

## 2014-06-07 MED ORDER — SODIUM CHLORIDE 0.9 % IV BOLUS (SEPSIS): 1000.0000 mL | Freq: Once | INTRAVENOUS | Status: DC

## 2014-06-07 MED ORDER — NALOXONE HCL 0.4 MG/ML IJ SOLN: 0.0400 mg | Freq: Once | INTRAMUSCULAR | Status: DC

## 2014-06-07 NOTE — ED Notes (Signed)
Patient here after [facility] with multi-substance abuse. Per [facility] FNP note patient became increasingly lethargic during assessment and VS changed. Per patient he is a long term user of marijuana, user of heroine for 2 years, Xanax sporadically, and Cocaine for about 3 years. [facility] personnel sent patient with paperwork and informed ED Charge RN that they would accept patient pending medical clearance. Last reported use of heroine was around 1800. Per charge RN discussion with [facility] personnel patient went to restroom to attempt to provide urine sample, afterward his VS decreased and patient became altered. During triage, patient was hard to keep awake, but responding appropriately. Dysarthria noted during triage, but patient states that he normally stutters. Denies pain at this time.

## 2014-06-07 NOTE — ED Notes (Signed)
This RN unable to obtain IV access. Janett BillowJennaya, RN to attempt.

## 2014-06-07 NOTE — Discharge Instructions (Signed)
Polysubstance Abuse James Crane, you were seen today for substance abuse.  You well to sober in emergency department. Please refer back to rehabilitation for continued treatment. Followup with her primary care physician within 3 days for continued care as well. If your symptoms worsen come back to emergency department for evaluation. Thank you. When people abuse more than one drug or type of drug it is called polysubstance or polydrug abuse. For example, many smokers also drink alcohol. This is one form of polydrug abuse. Polydrug abuse also refers to the use of a drug to counteract an unpleasant effect produced by another drug. It may also be used to help with withdrawal from another drug. People who take stimulants may become agitated. Sometimes this agitation is countered with a tranquilizer. This helps protect against the unpleasant side effects. Polydrug abuse also refers to the use of different drugs at the same time.  Anytime drug use is interfering with normal living activities, it has become abuse. This includes problems with family and friends. Psychological dependence has developed when your mind tells you that the drug is needed. This is usually followed by physical dependence which has developed when continuing increases of drug are required to get the same feeling or "high". This is known as addiction or chemical dependency. A person's risk is much higher if there is a history of chemical dependency in the family. SIGNS OF CHEMICAL DEPENDENCY  You have been told by friends or family that drugs have become a problem.  You fight when using drugs.  You are having blackouts (not remembering what you do while using).  You feel sick from using drugs but continue using.  You lie about use or amounts of drugs (chemicals) used.  You need chemicals to get you going.  You are suffering in work performance or in school because of drug use.  You get sick from use of drugs but continue to use  anyway.  You need drugs to relate to people or feel comfortable in social situations.  You use drugs to forget problems. "Yes" answered to any of the above signs of chemical dependency indicates there are problems. The longer the use of drugs continues, the greater the problems will become. If there is a family history of drug or alcohol use, it is best not to experiment with these drugs. Continual use leads to tolerance. After tolerance develops more of the drug is needed to get the same feeling. This is followed by addiction. With addiction, drugs become the most important part of life. It becomes more important to take drugs than participate in the other usual activities of life. This includes relating to friends and family. Addiction is followed by dependency. Dependency is a condition where drugs are now needed not just to get high, but to feel normal. Addiction cannot be cured but it can be stopped. This often requires outside help and the care of professionals. Treatment centers are listed in the yellow pages under: Cocaine, Narcotics, and Alcoholics Anonymous. Most hospitals and clinics can refer you to a specialized care center. Talk to your caregiver if you need help. Document Released: 04/02/2005 Document Revised: 11/03/2011 Document Reviewed: 08/11/2005 [facility] Patient Information 2015 [place], [place]. This information is not intended to replace advice given to you by your health care provider. Make sure you discuss any questions you have with your health care provider.  Emergency Department Resource Guide 1) Find a Doctor and Pay Out of Pocket Although you won't have to find out who  is covered by your insurance plan, it is a good idea to ask around and get recommendations. You will then need to call the office and see if the doctor you have chosen will accept you as a new patient and what types of options they offer for patients who are self-pay. Some doctors offer discounts or will set up  payment plans for their patients who do not have insurance, but you will need to ask so you aren't surprised when you get to your appointment.  2) Contact Your Local Health Department Not all health departments have doctors that can see patients for sick visits, but many do, so it is worth a call to see if yours does. If you don't know where your local health department is, you can check in your phone book. The CDC also has a tool to help you locate your state's health department, and many state websites also have listings of all of their local health departments.  3) Find a Walk-in Clinic If your illness is not likely to be very severe or complicated, you may want to try a walk in clinic. These are popping up all over the country in pharmacies, drugstores, and shopping centers. They're usually staffed by nurse practitioners or physician assistants that have been trained to treat common illnesses and complaints. They're usually fairly quick and inexpensive. However, if you have serious medical issues or chronic medical problems, these are probably not your best option.  No Primary Care Doctor: - Call Health Connect at  [phone] - they can help you locate a primary care doctor that  accepts your insurance, provides certain services, etc. - Physician Referral Service- [phone]  Chronic Pain Problems: Organization         Address  Phone   Notes  [facility]  [phone]) [phone] Patients need to be referred by their primary care doctor.   Medication Assistance: Organization         Address  Phone   Notes  [facility] [address] [place], [place] [address] [phone] --Must be a resident of [facility] [place] -- Must have NO insurance coverage whatsoever (no Medicaid/ Medicare, etc.) -- The pt. MUST have a primary care doctor that directs their care regularly and follows them in the community   MedAssist  [phone]   [employer]  [phone]    Agencies that provide inexpensive medical care: Organization         Address  Phone   Notes  [facility]  [phone]   [facility]    [phone]   [facility] [address] [place], [place] [address] [phone]) [phone]   [facility] [address], [place] [phone]) [phone]   [facility]anned [facility]    [phone](336) [phone]   [facility]    [phone]   [facility]  [address], [place] Phone:  [phone](336) [phone], Fax:  [phone] Hours of Operation:  9 am - 6 pm, M-F.  Also accepts Medicaid/Medicare and self-pay.  [facility]  [address], [place] Phone: [phone](336) [phone], Fax: [phone]. Hours of Operation:  8:30 am - 5:30 pm, M-F.  Also accepts Medicaid and self-pay.  [facility] [place] [address], [place] Phone: [phone](336) [phone]   [facility] [address], [place], [place] [phone],  Ext. [phone] Mondays & Thursdays: 7-9 AM.  First 15 patients are seen on a first come, first serve basis.    Medicaid-accepting [facility] Providers:  Organization         Address  Phone   Notes  [facility] [address], [place] [phone] Also accepts self-pay patients.  [facility] [address] Laurell Josephs [place], [place]  [phone]   [facility] [address], [place] [phone]   [facility] [address] [place] [phone]   Renaye Rakers [address], [place]   [phone] Only accepts [place] Access [place] patients after they have their name applied to their card.   Self-Pay (no insurance) in [facility]:  Organization         Address  Phone   Notes  Sickle Cell Patients, [facility] [address] [place], [place]  [phone]   [facility] [address] [place] [address] [place], [place] [phone]   [facility]  [address], [place] [phone]   [facility]/Dr. Osei-Bonsu  [address] [place] or [address], [place] [phone] Phone number for both [place] and [place] locations is the same.  [facility] [address] [place] [phone]   [facility] [address] [place] or [address] [phone]   [facility] [address], [place] [phone], phone; [phone], fax Sees patients 1st and 3rd Saturday of every month.  Must not qualify for public or private insurance (i.e. Medicaid, Medicare, [place] Health Choice, Veterans' Benefits)  Household income should be no more than 200% of the poverty level The clinic cannot treat you if you are pregnant or think you are pregnant  Sexually transmitted diseases are not treated at the clinic.    Dental Care: Organization         Address  Phone  Notes  [facility] [address] [place], [place] [phone] Accepts children up to age 69 who are enrolled in [place] or [place] Health Choice; pregnant women with a Medicaid card; and children who have applied for Medicaid or [place] Health Choice, but were declined, whose parents can pay a reduced fee at time of service.  [facility]  [address], [place] [phone] Accepts children up to age 39 who are enrolled in [place] or [place] Health Choice; pregnant women with a Medicaid card; and children who have applied for Medicaid or [place] Health Choice, but were declined, whose parents can pay a reduced fee at time of service.  [facility]  [address] [place], [place] [phone] Patients  are seen by appointment only. Walk-ins are not accepted. [facility] will see patients 68 years of age and older. Monday - Tuesday (8am-5pm) Most Wednesdays (8:30-5pm) $30 per visit, cash only  [facility]  [address], [facility] [phone] Patients are seen by appointment only. Walk-ins are not accepted. [facility] will see patients 87 years of age and older. One Wednesday Evening (Monthly: Volunteer Based).  $30 per visit, cash only  [employer]  [phone]  for adults; Children under age 19, call Graduate Pediatric Dentistry at [phone]. Children aged 60-14, please call [phone] to request a pediatric application.  Dental services are provided in all areas of dental care including fillings, crowns and bridges, complete and partial dentures, implants, gum treatment, root canals, and extractions. Preventive care is also provided. Treatment is provided to both adults and children. Patients are selected via a lottery and there is often a waiting list.   [facility] [address] [place]  [phone] [email or web address]   [facility] [address] [place], [place] [phone], Ext. [phone] Second and Fourth Thursday of each month, opens at 6:30 AM; Clinic ends at 9 AM.  Patients are seen on a first-come first-served basis, and a limited number are seen during each clinic.   [facility]  [address] Ether Griffins [place], [place] [phone]   Eligibility Requirements You must have lived in [place], [place], or [place] counties for at least the last three months.   You cannot be eligible for state or federal sponsored [place], including [employer], [place], or [employer].   You generally cannot be eligible for healthcare insurance through your employer.    How to apply: Eligibility screenings are held every Tuesday and Wednesday afternoon from 1:00 pm until  4:00 pm. You do not need an appointment for the interview!  [facility] [address], [place], [place] [phone]   [facility]  [phone]   [facility] Health Department  [phone]   [facility]  [phone]    Behavioral Health Resources in the Community: Intensive Outpatient Programs Organization         Address  Phone  Notes  [facility] Services [address] [place], [place] [phone]   [facility]patient [address], [place], [place] [phone]   [facility]: [facility] Drug [facility] [address] [place], [place]  [phone]   [facility] Mental Health [address]  [place], [place] [phone] or [phone]   Substance Abuse Resources Organization         Address  Phone  Notes  Al[facility] Drug Services  [phone]   [facility]  [phone]   [facility]  [phone]   Floydene Flock  [phone]   Residential & Outpatient Substance Abuse Program  [phone]   Psychological Services Organization         Address  Phone  Notes  [facility] Behavioral Health  [phone]   [facility]  [phone]   [facility] Mental Health [address], [place] [phone] or [phone]    Mobile Crisis Teams Organization         Address  Phone  Notes  [facility], Mobile Crisis Care Unit  [phone]   [facility]  [address] [place], [place] [phone]   Doristine Locks [address] [place] [phone]    Self-Help/Support Groups Organization         Address  Phone             Notes  [facility] [place] - variety of support groups  336- I7437963 Call for more information  Narcotics Anonymous (NA), [facility] [address], [employer]   2 meetings at this location   Educational psychologist  Notes  [facility] [address],    [place]  [phone]   [facility]  [address], [place] [phone], [place], [place] [phone]   [facility] [address], [place] [phone] Admissions: 8am-3pm M-F  [facility] [address]    [place], [place] [phone]   [facility] [address], [place], [place] [phone]   [facility] [address]  [place], [place] [phone]   [facility] - Intensive Outpatient [address], [place], [place] [phone]   [facility] ([facility] [address]  [place], [place] [phone] or [phone]   [facility] ([facility]) [address] [place], [place] [phone] Accepts Medicaid  [facility] [address] [place] [address]  [place] [phone] Substance Abuse/Addiction Treatment   [facility] Behavioral Health Resources Organization         Address  Phone  Notes  [facility]  [phone](888) [phone]   Angie FavaJulie Brannon, PhD [address] [place], [place]   [phone]) [phone] or [phone](336) [phone]   [facility]   [address] [place], [place] [phone](336) [phone]   [facility] [address], [place], [place] [phone](336) [phone] Insurance/Medicaid/sponsorship through [facility]  [facility] [address]                                    [place], [place] [phone](336) [phone] Therapy/tele-psych/case  [facility] [address]   [place], [place] [phone](336) [phone]    Dr. Lolly MustacheArfeen  [phone]) [phone]   [facility]  [employer] [facility] 1) [address], [place] 2) [address], [place] 3)  [address], [place] [phone]) [phone](336) [phone]  [phone]   [facility] Child Abuse Hotline [phone](336) [phone] or [phone](336) [phone] (After Hours)

## 2014-06-07 NOTE — ED Notes (Addendum)
This RN spoke with Marchelle FolksAmanda, RN at [facility], who states that she would like to speak with the provider before she accepts the pt.

## 2014-06-07 NOTE — ED Notes (Signed)
Call [facility] with update if patient is cleared by psych and EDP, Phone #: [phone]

## 2014-06-07 NOTE — ED Notes (Signed)
Mora Bellmanni, MD speaking with Marchelle FolksAmanda, RN at [facility] at this time.

## 2014-06-07 NOTE — ED Provider Notes (Signed)
CSN: [id number]     Arrival date & time 06/07/14  0144 History   First MD Initiated Contact with Patient 06/07/14 0222     Chief Complaint  Patient presents with  . Addiction Problem  . Altered Mental Status  . Medical Clearance     (Consider location/radiation/quality/duration/timing/severity/associated sxs/prior Treatment) HPI Wynonia Hazardicholas G Miles is a 23 y.o. male with Past medical history of substance abuse coming in after being transferred from a rehabilitation center For medical clearance. Patient states he was anxious and crying due to stress in his life. Because of this they did not want him at the facility and presented to the emergency department. Nursing staff tells me that the patient went to the bathroom and came out more altered in a concern for drug use. Patient states he was searched twice prior to using the restroom and denies using any drugs in the restroom. He states he used IV heroine at 6 PM last night and presented to the rehabilitation center at 11 PM. He has also recently abuse cocaine and Xanax over the last week. He denies alcohol abuse. He denies any any SI or HI currently. He is having no hallucinations. Patient has no medical complaints, no headache chest pain shortness of breath abdominal pain or changes in his bowel or bladder.  10 Systems reviewed and are negative for acute change except as noted in the HPI.     Past Medical History  Diagnosis Date  . Heroin addiction    Past Surgical History  Procedure Laterality Date  . Knee arthroscopy    . Shoulder arthroscopy     History reviewed. No pertinent family history. History  Substance Use Topics  . Smoking status: Current Every Day Smoker -- 0.50 packs/day    Types: Cigarettes  . Smokeless tobacco: Not on file  . Alcohol Use: No    Review of Systems    Allergies  Other  Home Medications   Prior to Admission medications   Not on File   BP 104/54  Pulse 62  Temp(Src) 97.7 F (36.5 C)  (Oral)  Resp 13  SpO2 95% Physical Exam  Nursing note and vitals reviewed. Constitutional: He is oriented to person, place, and time. Vital signs are normal. He appears well-developed and well-nourished.  Non-toxic appearance. He does not appear ill. No distress.  Drowsy but arousable.  HENT:  Head: Normocephalic and atraumatic.  Nose: Nose normal.  Mouth/Throat: Oropharynx is clear and moist. No oropharyngeal exudate.  Eyes: Conjunctivae and EOM are normal. Pupils are equal, round, and reactive to light. No scleral icterus.  Neck: Normal range of motion. Neck supple. No tracheal deviation, no edema, no erythema and normal range of motion present. No mass and no thyromegaly present.  Cardiovascular: Normal rate, regular rhythm, S1 normal, S2 normal, normal heart sounds, intact distal pulses and normal pulses.  Exam reveals no gallop and no friction rub.   No murmur heard. Pulses:      Radial pulses are 2+ on the right side, and 2+ on the left side.       Dorsalis pedis pulses are 2+ on the right side, and 2+ on the left side.  Pulmonary/Chest: Effort normal and breath sounds normal. No respiratory distress. He has no wheezes. He has no rhonchi. He has no rales.  Abdominal: Soft. Normal appearance and bowel sounds are normal. He exhibits no distension, no ascites and no mass. There is no hepatosplenomegaly. There is no tenderness. There is no rebound, no guarding  and no CVA tenderness.  Musculoskeletal: Normal range of motion. He exhibits no edema and no tenderness.  Track marks on bilateral upper and lower strength he seen, likely from substance abuse.  Lymphadenopathy:    He has no cervical adenopathy.  Neurological: He is alert and oriented to person, place, and time. He has normal strength. No cranial nerve deficit or sensory deficit. GCS eye subscore is 4. GCS verbal subscore is 5. GCS motor subscore is 6.  Skin: Skin is warm, dry and intact. No petechiae and no rash noted. He is not  diaphoretic. No erythema. No pallor.  Psychiatric: He has a normal mood and affect. His behavior is normal. Judgment normal.    ED Course  Procedures (including critical care time) Labs Review Labs Reviewed  CBC - Abnormal; Notable for the following:    RBC 4.15 (*)    Hemoglobin 12.3 (*)    HCT 36.1 (*)    All other components within normal limits  COMPREHENSIVE METABOLIC PANEL - Abnormal; Notable for the following:    Total Bilirubin 0.2 (*)    All other components within normal limits  SALICYLATE LEVEL - Abnormal; Notable for the following:    Salicylate Lvl <2.0 (*)    All other components within normal limits  ACETAMINOPHEN LEVEL  ETHANOL    Imaging Review Dg Chest Port 1 View  06/07/2014   CLINICAL DATA:  Altered mental status.  Initial encounter.  EXAM: PORTABLE CHEST - 1 VIEW  COMPARISON:  None.  FINDINGS: Normal heart size and mediastinal contours. No acute infiltrate or edema. No effusion or pneumothorax. Possible calcified granuloma in the right upper lobe. No acute osseous findings.  IMPRESSION: No active disease.   Electronically Signed   By: Tiburcio PeaJonathan  Watts M.D.   On: 06/07/2014 03:26     EKG Interpretation   Date/Time:  Wednesday June 07 2014 02:20:17 EDT Ventricular Rate:  63 PR Interval:  145 QRS Duration: 95 QT Interval:  403 QTC Calculation: 412 R Axis:   83 Text Interpretation:  Sinus rhythm RSR' in V1 or V2, probably normal  variant Benign early repolarization Confirmed by Erroll Lunani, Lawanna Cecere Ayokunle  [id number]) on 06/07/2014 3:33:08 AM      MDM   Final diagnoses:  Altered mental state    Patient presents emergency department for medical clearance for rehabilitation. He is currently drowsy but arousable, consistent with heroin use earlier today. Patient vital signs are within his normal limits  and oxygen saturations have maintained above 95%. EKG shows ST elevations, but likely in an early repolarization pattern. He's having no chest pain I have low  concern for ACS, there eis no reciprocal change. We did not obtain troponin, and we do not need urine drug screen of the patient has told us what he uses. Will observe overnight and discharge in the morning for rehabilitation.    Tomasita CrumbleAdeleke Coni Homesley, MD 06/07/14 [phone]

## 2014-06-07 NOTE — ED Notes (Signed)
Oni, MD states that the pt does not need an IV.

## 2014-06-07 NOTE — ED Notes (Signed)
Called [facility] and spoke to sherry. She advised that she will send a driver

## 2015-05-03 IMAGING — CR DG KNEE 1-2V*L*
2 series · 2 of 2 positions shown · non-contrast
Comparison: None.

CLINICAL DATA: Left knee pain

LEFT KNEE - 1-2 VIEW

[view not recorded (1 of 2)]
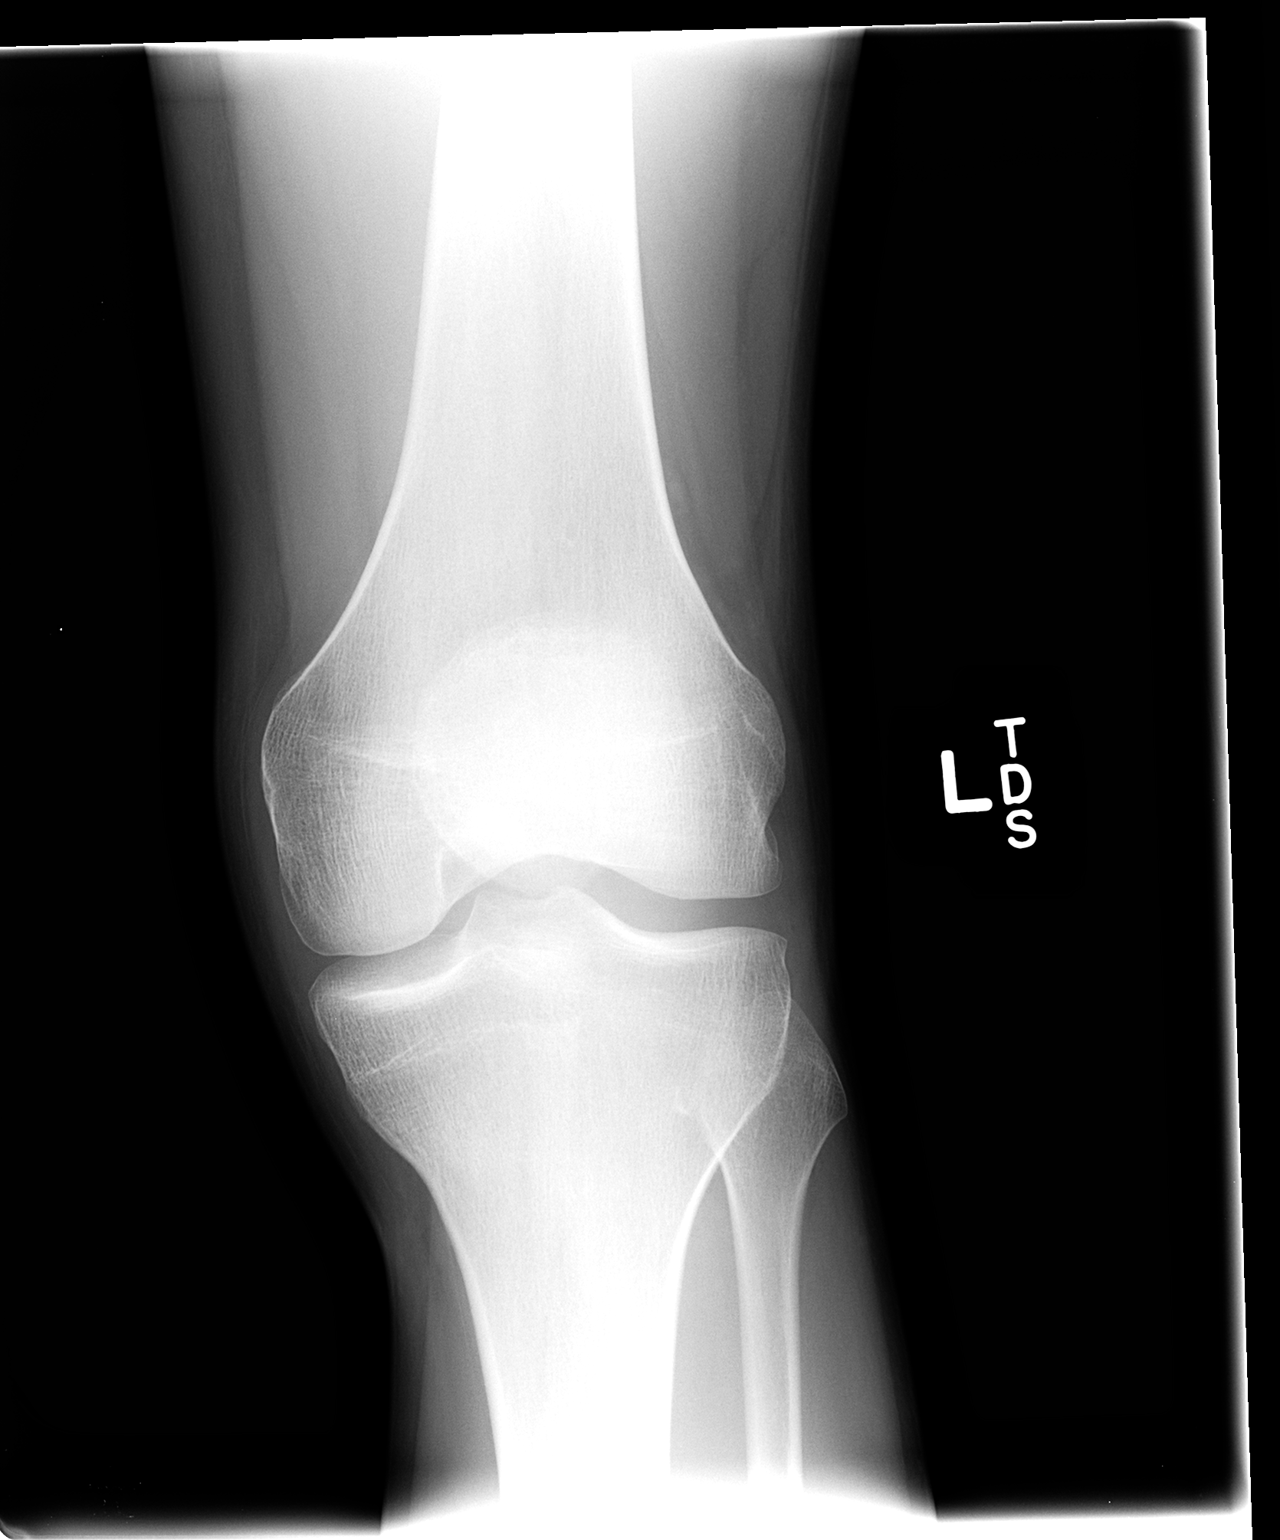

[view not recorded (2 of 2)]
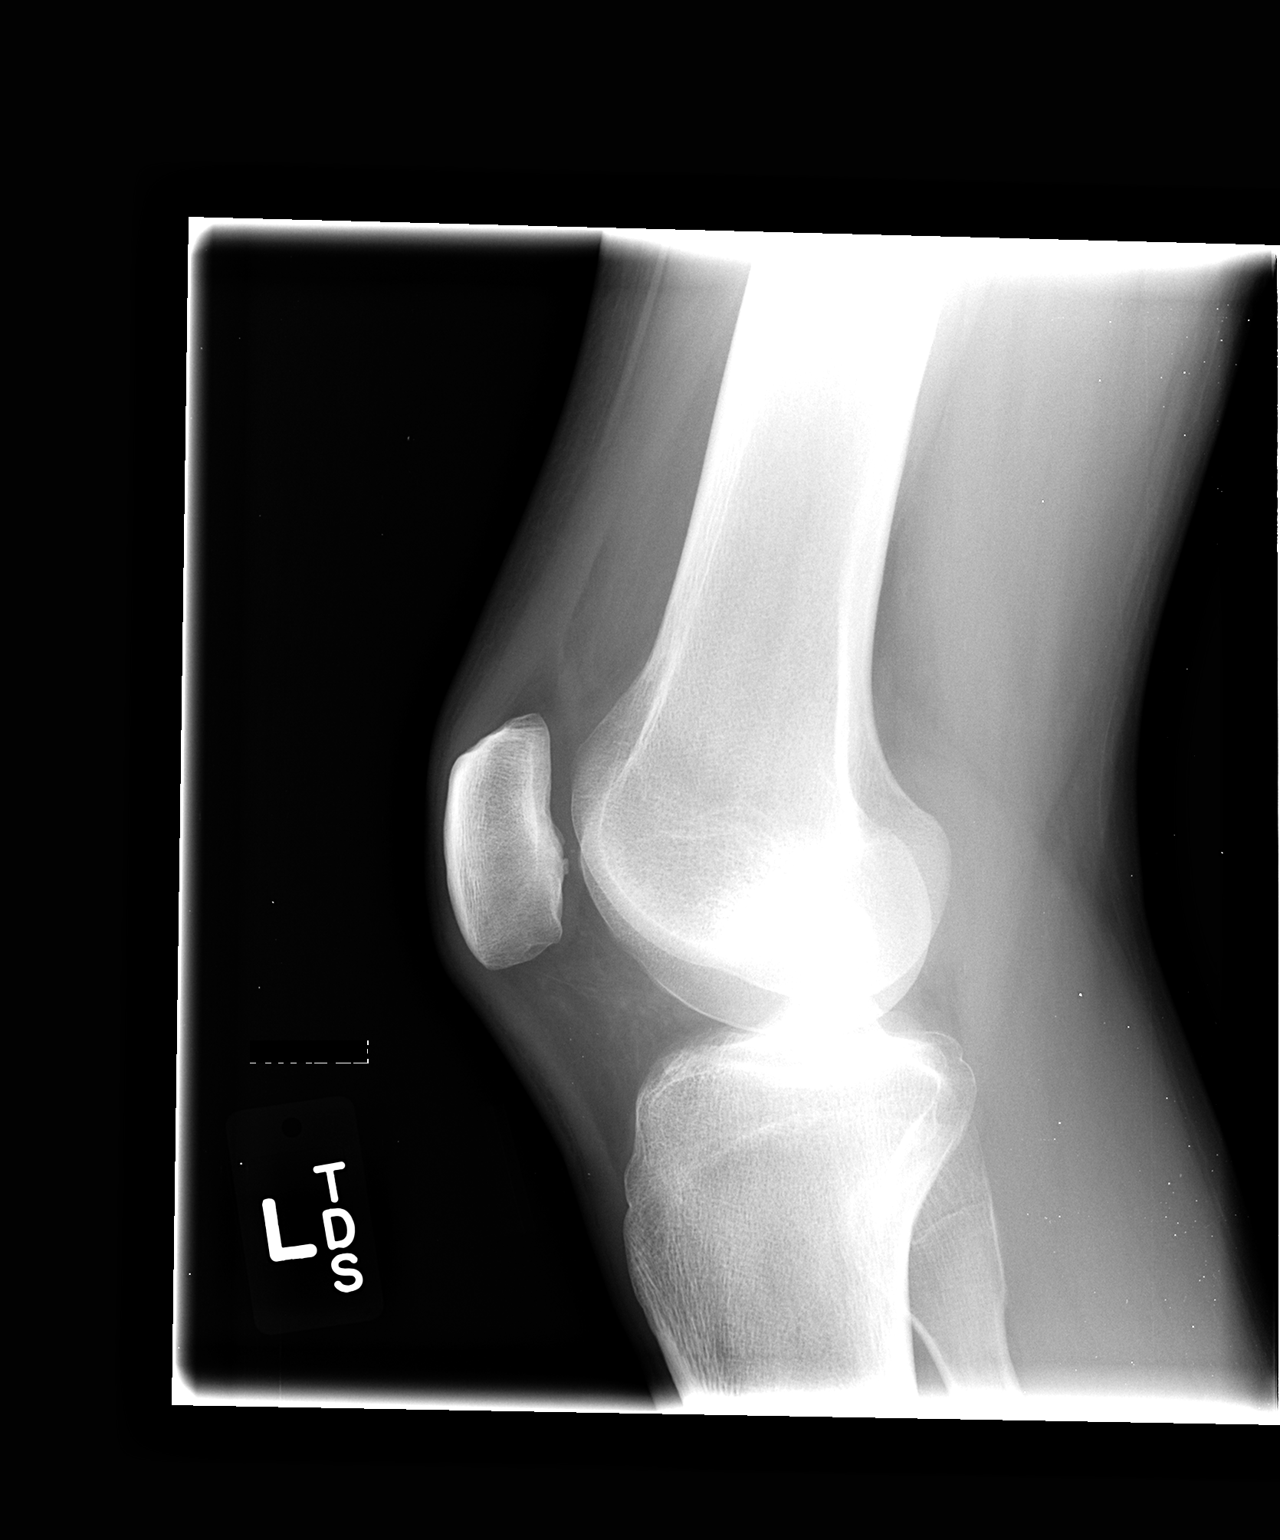

[2 of 2 positions shown; findings below may reference images not displayed]

FINDINGS: No fracture or dislocation is seen.

The joint spaces are preserved.

Moderate suprapatellar knee joint effusion.
IMPRESSION: No fracture or dislocation is seen.

Moderate suprapatellar knee joint effusion.

## 2016-01-15 ENCOUNTER — Emergency Department (HOSPITAL_COMMUNITY)
Admission: EM | Admit: 2016-01-15 | Discharge: 2016-01-15 | Disposition: A | Discharge status: Home / Self Care | Payer: Medicaid Other | Attending: Dermatology | Admitting: Dermatology

## 2016-01-15 ENCOUNTER — Encounter (HOSPITAL_COMMUNITY): Payer: Self-pay | Admitting: Emergency Medicine

## 2016-01-15 DIAGNOSIS — M25561 Pain in right knee: Secondary | ICD-10-CM | POA: Insufficient documentation

## 2016-01-15 NOTE — ED Notes (Signed)
Called for room. No response 

## 2016-01-15 NOTE — ED Notes (Signed)
Pt states that he has had two knee surgeries on his R knee and needs one on his L and his R one has started bothering him again. States he took a step and now has more pain. Alert and oriented.

## 2016-01-15 NOTE — ED Notes (Signed)
Called without response from lobby  

## 2017-02-17 ENCOUNTER — Encounter (HOSPITAL_COMMUNITY): Payer: Self-pay | Admitting: Family Medicine

## 2017-02-17 ENCOUNTER — Ambulatory Visit (HOSPITAL_COMMUNITY)
Admission: RE | Admit: 2017-02-17 | Discharge: 2017-02-17 | Disposition: A | Discharge status: Home / Self Care | Payer: Self-pay | Attending: Psychiatry | Admitting: Psychiatry

## 2017-02-17 ENCOUNTER — Emergency Department (HOSPITAL_COMMUNITY)
Admission: EM | Admit: 2017-02-17 | Discharge: 2017-02-18 | Disposition: A | Discharge status: Home / Self Care | Payer: Medicaid Other | Attending: Emergency Medicine | Admitting: Emergency Medicine

## 2017-02-17 DIAGNOSIS — Z5181 Encounter for therapeutic drug level monitoring: Secondary | ICD-10-CM | POA: Insufficient documentation

## 2017-02-17 DIAGNOSIS — F4329 Adjustment disorder with other symptoms: Secondary | ICD-10-CM | POA: Diagnosis present

## 2017-02-17 DIAGNOSIS — R45851 Suicidal ideations: Secondary | ICD-10-CM

## 2017-02-17 DIAGNOSIS — F1910 Other psychoactive substance abuse, uncomplicated: Secondary | ICD-10-CM

## 2017-02-17 DIAGNOSIS — F1510 Other stimulant abuse, uncomplicated: Secondary | ICD-10-CM | POA: Insufficient documentation

## 2017-02-17 DIAGNOSIS — F1520 Other stimulant dependence, uncomplicated: Secondary | ICD-10-CM | POA: Diagnosis present

## 2017-02-17 DIAGNOSIS — F17210 Nicotine dependence, cigarettes, uncomplicated: Secondary | ICD-10-CM | POA: Insufficient documentation

## 2017-02-17 DIAGNOSIS — F1110 Opioid abuse, uncomplicated: Secondary | ICD-10-CM | POA: Insufficient documentation

## 2017-02-17 LAB — CBC
HEMATOCRIT: 36.3 % — AB (ref 39.0–52.0)
HEMOGLOBIN: 12.4 g/dL — AB (ref 13.0–17.0)
MCH: 30.5 pg (ref 26.0–34.0)
MCHC: 34.2 g/dL (ref 30.0–36.0)
MCV: 89.2 fL (ref 78.0–100.0)
Platelets: 288 10*3/uL (ref 150–400)
RBC: 4.07 MIL/uL — ABNORMAL LOW (ref 4.22–5.81)
RDW: 12.7 % (ref 11.5–15.5)
WBC: 8.3 10*3/uL (ref 4.0–10.5)

## 2017-02-17 LAB — RAPID URINE DRUG SCREEN, HOSP PERFORMED
Amphetamines: POSITIVE — AB
BARBITURATES: NOT DETECTED
BENZODIAZEPINES: NOT DETECTED
Cocaine: NOT DETECTED
Opiates: POSITIVE — AB
TETRAHYDROCANNABINOL: NOT DETECTED

## 2017-02-17 NOTE — ED Provider Notes (Signed)
[facility]-EMERGENCY DEPT Provider Note   CSN: [id number] Arrival date & time: 02/17/17  2059   By signing my name below, I, Clarisse Gouge, attest that this documentation has been prepared under the direction and in the presence of Pirozzi, Mayer Masker, MD. Electronically signed, Clarisse Gouge, ED Scribe. 02/17/17. 11:41 PM.   History   Chief Complaint Chief Complaint  Patient presents with  . Psychiatric Evaluation   The history is provided by the patient and medical records. No language interpreter was used.    James Crane is a 26 y.o. male with h/o substance abuse voluntarily presenting to the Emergency Department requesting a detox from heroine and meth. Pt states he last used ~noon today. No ETOH or benzodiazepine use noted.Patient reports increased stressors including just being discharged from jail.  Pt also reports depression described as "hopelessness". When asked if he is suicidal, he expresses thoughts that "it might be easier if [he] weren't here." No plan or access to firearms. No chest pain, N/V, SOB. No other complaints at this time.   Patient was assisted behavioral health and sent for medical clearance. Reportedly he meets inpatient criteria.  Past Medical History:  Diagnosis Date  . Heroin addiction (HCC)     There are no active problems to display for this patient.   Past Surgical History:  Procedure Laterality Date  . KNEE ARTHROSCOPY    . SHOULDER ARTHROSCOPY         Home Medications    Prior to Admission medications   Not on File    Family History History reviewed. No pertinent family history.  Social History Social History  Substance Use Topics  . Smoking status: Current Every Day Smoker    Packs/day: 0.50    Types: Cigarettes  . Smokeless tobacco: Never Used  . Alcohol use Yes     Comment: Once a year      Allergies   Other   Review of Systems Review of Systems  Respiratory: Negative for shortness of breath.   Cardiovascular:  Negative for chest pain.  Gastrointestinal: Negative for abdominal pain, nausea and vomiting.  Psychiatric/Behavioral: Positive for dysphoric mood and suicidal ideas. Negative for self-injury.  All other systems reviewed and are negative.    Physical Exam Updated Vital Signs BP 114/71 (BP Location: Left Arm)   Pulse 72   Temp 97.4 F (36.3 C) (Oral)   Resp 20   Ht 6\' 3"  (1.905 m)   Wt 185 lb (83.9 kg)   SpO2 98%   BMI 23.12 kg/m   Physical Exam  Constitutional: He is oriented to person, place, and time. He appears well-developed and well-nourished. No distress.  HENT:  Head: Normocephalic and atraumatic.  Cardiovascular: Normal rate, regular rhythm and normal heart sounds.   No murmur heard. Pulmonary/Chest: Effort normal and breath sounds normal. No respiratory distress. He has no wheezes.  Lymphadenopathy:    He has no cervical adenopathy.  Neurological: He is alert and oriented to person, place, and time.  Skin: Skin is warm and dry.  Psychiatric: He has a normal mood and affect.  Nursing note and vitals reviewed.    ED Treatments / Results  DIAGNOSTIC STUDIES: Oxygen Saturation is 98% on RA, NL by my interpretation.    COORDINATION OF CARE: 11:24 PM-Discussed next steps with pt. Pt verbalized understanding and is agreeable with the plan. Pt prepared for TTS evaluation.   Labs (all labs ordered are listed, but only abnormal results are displayed) Labs Reviewed  COMPREHENSIVE  METABOLIC PANEL - Abnormal; Notable for the following:       Result Value   BUN 23 (*)    AST 45 (*)    ALT 66 (*)    All other components within normal limits  ACETAMINOPHEN LEVEL - Abnormal; Notable for the following:    Acetaminophen (Tylenol), Serum <10 (*)    All other components within normal limits  CBC - Abnormal; Notable for the following:    RBC 4.07 (*)    Hemoglobin 12.4 (*)    HCT 36.3 (*)    All other components within normal limits  RAPID URINE DRUG SCREEN, HOSP  PERFORMED - Abnormal; Notable for the following:    Opiates POSITIVE (*)    Amphetamines POSITIVE (*)    All other components within normal limits  ETHANOL  SALICYLATE LEVEL    EKG  EKG Interpretation None       Radiology No results found.  Procedures Procedures (including critical care time)  Medications Ordered in ED Medications - No data to display   Initial Impression / Assessment and Plan / ED Course  I have reviewed the triage vital signs and the nursing notes.  Pertinent labs & imaging results that were available during my care of the patient were reviewed by me and considered in my medical decision making (see chart for details).     Patient presents from behavioral health for medical clearance. Reports passive suicidal ideation and polysubstance abuse. Nontoxic. Vital signs reassuring. Workup initiated. Upon medical clearance will discuss with behavioral health regarding disposition.  12:37 AM [facility]  Final Clinical Impressions(s) / ED Diagnoses   Final diagnoses:  Polysubstance abuse  Suicidal ideation    New Prescriptions New Prescriptions   No medications on file   I personally performed the services described in this documentation, which was scribed in my presence. The recorded information has been reviewed and is accurate.    Shon BatonHorton, Yago Ludvigsen F, MD 02/18/17 [phone]

## 2017-02-17 NOTE — Progress Notes (Signed)
Per James Crane, [name] NP meets inpatient criteria. [facility] Micaela Stith K. Sherlon HandingHarris, LCAS-A, LPC-A, [facility]  Counselor 02/17/2017 8:23 PM

## 2017-02-17 NOTE — H&P (Signed)
Behavioral Health Medical Screening Exam  James Crane is an 26 y.o. male [facility] psychiatric evaluation due to depressive symptoms x several weeks duration, culminating in passive SI, commensurate with illicit drug use to include Heroin and Methadone. Stressors include adjusting after getting out of jail, discord between he and his partner and his child. Patient is denying any acute ailments.   Total Time spent with patient: 20 minutes  Psychiatric Specialty Exam: Physical Exam  Constitutional: He is oriented to person, place, and time. No distress.  HENT:  Head: Normocephalic.  Eyes: Pupils are equal, round, and reactive to light.  Respiratory: Effort normal and breath sounds normal.  Neurological: He is alert and oriented to person, place, and time. No cranial nerve deficit.  Skin: Skin is warm and dry. He is not diaphoretic.  Psychiatric: His speech is normal and behavior is normal. Thought content normal. His mood appears anxious. Cognition and memory are normal. He expresses inappropriate judgment. He exhibits a depressed mood.    Review of Systems  Psychiatric/Behavioral: Positive for depression and substance abuse. The patient is nervous/anxious.   All other systems reviewed and are negative.   There were no vitals taken for this visit.There is no height or weight on file to calculate BMI.  General Appearance: Disheveled  Eye Contact:  Good  Speech:  Clear and Coherent  Volume:  Normal  Mood:  Anxious and Depressed  Affect:  Congruent  Thought Process:  Goal Directed  Orientation:  Full (Time, Place, and Person)  Thought Content:  Negative  Suicidal Thoughts:  Yes.  without intent/plan  Homicidal Thoughts:  No  Memory:  Immediate;   Fair  Judgement:  Poor  Insight:  Lacking  Psychomotor Activity:  Negative  Concentration: Concentration: Fair  Recall:  [employer]Fair  Fund of Knowledge:Fair  Language: Fair  Akathisia:  Negative  Handed:   Right  AIMS (if indicated):     Assets:  Desire for Improvement  Sleep:       Musculoskeletal: Strength & Muscle Tone: within normal limits Gait & Station: normal Patient leans: N/A  There were no vitals taken for this visit.  Recommendations:  Based on my evaluation the patient does not appear to have an emergency medical condition.  Kerry HoughSpencer E Anthonio Mizzell, PA-C 02/17/2017, 9:34 PM

## 2017-02-17 NOTE — ED Triage Notes (Signed)
[facility], pt is having complications with depression, passive SI, and family complications. Pt recently got out of jail. Pt is calm and cooperative.

## 2017-02-17 NOTE — BH Assessment (Signed)
Tele Assessment Note   James Crane is an 26 y.o. male, Caucasian, single who presents to [facility] as a walk-in. Patient states primary concern is drug use abuse with Heroine. Patient states he resides with g/f and his child less than 40 year old. Patient states no hx. Of Mental illness. Patient states has had loss of sleep, 4 hours or less per night. Patient acknowledges current SI, no plan. Patient denies current HI and AVH. Patient acknowledges hx. Of S.A. With Opiates, Heroine last use today unspecified amount. Patient also has been to residential S.A. treatment with [facility] last in 2015.  Patient denies hx. Of Inpatient or outpatient psych care. Patient is dressed in normal attire and is alert and oriented x4. Patient speech was within normal limits and motor behavior appeared normal. Patient thought process is coherent. Patient  does not appear to be responding to internal stimuli. Patient was cooperative throughout the assessment and states that he is agreeable to inpatient psychiatric treatment.   Diagnosis: Major Depressive Disorder; Opiate Use Disorder, Severe  Past Medical History:  Past Medical History:  Diagnosis Date  . Heroin addiction [facility])     Past Surgical History:  Procedure Laterality Date  . KNEE ARTHROSCOPY    . SHOULDER ARTHROSCOPY      Family History: No family history on file.  Social History:  reports that he has been smoking Cigarettes.  He has been smoking about 0.50 packs per day. He does not have any smokeless tobacco history on file. He reports that he uses drugs, including Marijuana, Cocaine, and Heroin. He reports that he does not drink alcohol.  Additional Social History:  Alcohol / Drug Use Pain Medications: SEE MAR Prescriptions: SEE MAR Over the Counter: SEE MAR History of alcohol / drug use?: Yes Longest period of sobriety (when/how long): 8 months 2016 Negative Consequences of Use: Financial, Legal, Personal relationships, Work /  School Withdrawal Symptoms: Patient aware of relationship between substance abuse and physical/medical complications Substance #1 Name of Substance 1: Opiates, Heroine 1 - Age of First Use: 15 1 - Amount (size/oz): various 1 - Frequency: weekly 1 - Duration: years 1 - Last Use / Amount: 02/17/17 unspecified amount  CIWA:   COWS:    PATIENT STRENGTHS: (choose at least two) Ability for insight Active sense of humor Communication skills  Allergies:  Allergies  Allergen Reactions  . Other Nausea And Vomiting    "Anesthesia"- unknown specific medication name    Home Medications:  (Not in a hospital admission)  OB/GYN Status:  No LMP for male patient.  General Assessment Data Location of Assessment: [facility] Assessment Services TTS Assessment: In system Is this a Tele or Face-to-Face Assessment?: Face-to-Face Is this an Initial Assessment or a Re-assessment for this encounter?: Initial Assessment Marital status: Single Maiden name: n/a Is patient pregnant?: No Pregnancy Status: No Living Arrangements: Spouse/significant other Can pt return to current living arrangement?: Yes Admission Status: Voluntary Is patient capable of signing voluntary admission?: Yes Referral Source: Self/Family/Friend Insurance type: SP  Medical Screening Exam [facility] Walk-in ONLY) Medical Exam completed: Yes  Crisis Care Plan Living Arrangements: Spouse/significant other Name of Psychiatrist: none Name of Therapist: none  Education Status Is patient currently in school?: No Current Grade: n/a Highest grade of school patient has completed: GED Name of school: n/a Contact person: mother  Risk to self with the past 6 months Suicidal Ideation: Yes-Currently Present Has patient been a risk to self within the past 6 months prior to admission? :  No Suicidal Intent: No Has patient had any suicidal intent within the past 6 months prior to admission? : No Is patient at risk for suicide?: No Suicidal  Plan?: No Has patient had any suicidal plan within the past 6 months prior to admission? : No Access to Means: No What has been your use of drugs/alcohol within the last 12 months?: Heroine Previous Attempts/Gestures: No How many times?: 0 Other Self Harm Risks: none Triggers for Past Attempts: Unpredictable Intentional Self Injurious Behavior: None Family Suicide History: No Recent stressful life event(s): Turmoil (Comment) Persecutory voices/beliefs?: No Depression: Yes Depression Symptoms: Fatigue, Loss of interest in usual pleasures, Feeling worthless/self pity Substance abuse history and/or treatment for substance abuse?: Yes Suicide prevention information given to non-admitted patients: Not applicable  Risk to Others within the past 6 months Homicidal Ideation: No Does patient have any lifetime risk of violence toward others beyond the six months prior to admission? : No Thoughts of Harm to Others: No Current Homicidal Intent: No Current Homicidal Plan: No Access to Homicidal Means: No Identified Victim: none History of harm to others?: No Assessment of Violence: None Noted Violent Behavior Description: n/a Does patient have access to weapons?: No Criminal Charges Pending?: No Does patient have a court date: No Is patient on probation?: Yes  Psychosis Hallucinations: None noted Delusions: None noted  Mental Status Report Appearance/Hygiene: Unremarkable Eye Contact: Fair Motor Activity: Freedom of movement Speech: Logical/coherent Level of Consciousness: Alert Mood: Depressed Affect: Depressed Anxiety Level: Moderate Thought Processes: Relevant Judgement: Impaired Orientation: Person, Place, Time, Situation, Appropriate for developmental age Obsessive Compulsive Thoughts/Behaviors: Moderate  Cognitive Functioning Concentration: Decreased Memory: Recent Intact, Remote Intact IQ: Average Insight: Fair Impulse Control: Poor Appetite: Fair Weight Loss:  0 Weight Gain: 0 Sleep: Decreased Total Hours of Sleep: 4 Vegetative Symptoms: None  ADLScreening [facility]) Patient's cognitive ability adequate to safely complete daily activities?: Yes Patient able to express need for assistance with ADLs?: Yes Independently performs ADLs?: Yes (appropriate for developmental age)  Prior Inpatient Therapy Prior Inpatient Therapy: No Prior Therapy Dates: n/a Prior Therapy Facilty/Provider(s): n/a Reason for Treatment: n/a  Prior Outpatient Therapy Prior Outpatient Therapy: No Prior Therapy Dates: n/a Prior Therapy Facilty/Provider(s): n/a Reason for Treatment: n/a Does patient have an ACCT team?: No Does patient have Intensive In-House Services?  : No Does patient have [facility] services? : No Does patient have P4CC services?: No  ADL Screening (condition at time of admission) Patient's cognitive ability adequate to safely complete daily activities?: Yes Is the patient deaf or have difficulty hearing?: No Does the patient have difficulty seeing, even when wearing glasses/contacts?: No Does the patient have difficulty concentrating, remembering, or making decisions?: No Patient able to express need for assistance with ADLs?: Yes Does the patient have difficulty dressing or bathing?: No Independently performs ADLs?: Yes (appropriate for developmental age) Does the patient have difficulty walking or climbing stairs?: No Weakness of Legs: None Weakness of Arms/Hands: None       Abuse/Neglect Assessment (Assessment to be complete while patient is alone) Physical Abuse: Denies Verbal Abuse: Denies Sexual Abuse: Denies Exploitation of patient/patient's resources: Denies Self-Neglect: Denies Values / Beliefs Cultural Requests During Hospitalization: None Spiritual Requests During Hospitalization: None   Advance Directives (For Healthcare) Does Patient Have a Medical Advance Directive?: No    Additional Information 1:1 In  Past 12 Months?: No CIRT Risk: No Elopement Risk: No Does patient have medical clearance?: No     Disposition: Per Donell SievertSpencer, [name] NP meets  inpatient criteria Disposition Initial Assessment Completed for this Encounter: Yes Disposition of Patient: Other dispositions (TBD)  Hipolito Bayley 02/17/2017 8:15 PM

## 2017-02-18 DIAGNOSIS — F17210 Nicotine dependence, cigarettes, uncomplicated: Secondary | ICD-10-CM

## 2017-02-18 DIAGNOSIS — F1910 Other psychoactive substance abuse, uncomplicated: Secondary | ICD-10-CM

## 2017-02-18 DIAGNOSIS — F1520 Other stimulant dependence, uncomplicated: Secondary | ICD-10-CM | POA: Diagnosis present

## 2017-02-18 DIAGNOSIS — F4329 Adjustment disorder with other symptoms: Secondary | ICD-10-CM | POA: Diagnosis present

## 2017-02-18 LAB — COMPREHENSIVE METABOLIC PANEL
ALT: 66 U/L — ABNORMAL HIGH (ref 17–63)
ANION GAP: 8 (ref 5–15)
AST: 45 U/L — ABNORMAL HIGH (ref 15–41)
Albumin: 3.9 g/dL (ref 3.5–5.0)
Alkaline Phosphatase: 50 U/L (ref 38–126)
BUN: 23 mg/dL — AB (ref 6–20)
CO2: 28 mmol/L (ref 22–32)
Calcium: 9.1 mg/dL (ref 8.9–10.3)
Chloride: 103 mmol/L (ref 101–111)
Creatinine, Ser: 0.8 mg/dL (ref 0.61–1.24)
Glucose, Bld: 92 mg/dL (ref 65–99)
POTASSIUM: 4.2 mmol/L (ref 3.5–5.1)
Sodium: 139 mmol/L (ref 135–145)
TOTAL PROTEIN: 7.3 g/dL (ref 6.5–8.1)
Total Bilirubin: 0.5 mg/dL (ref 0.3–1.2)

## 2017-02-18 LAB — ETHANOL

## 2017-02-18 LAB — SALICYLATE LEVEL

## 2017-02-18 LAB — ACETAMINOPHEN LEVEL

## 2017-02-18 NOTE — BH Assessment (Signed)
[facility] Assessment Progress Note  Per Mojeed Akintayo, MD, this pt does not require psychiatric hospitalization at this time.  [facility] is to be [facility] from [facility] with recommendation to follow up with [facility].  This has been included in pt's discharge instructions.  Pt's nurse, Ashley, has been notified.  Savvy Peeters, MA Triage Specialist [phone]      

## 2017-02-18 NOTE — Consult Note (Signed)
[place] Psychiatry Consult   Reason for Consult:  Suicidal ideation without a plan Referring Physician:  EDP Patient Identification: James Crane MRN:  [id number] Principal Diagnosis: Adjustment disorder with mixed emotional features Diagnosis:   Patient Active Problem List   Diagnosis Date Noted  . 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 Adjustment disorder with mixed emotional features [F43.29] 02/18/2017    Total Time spent with patient: 30 minutes  Subjective:   James Crane is a 26 y.o. male patient admitted with suicidal ideation without a plan.  HPI:  James Crane is a 26 year old male who presented as a walk in at [facility] requesting detox from drugs. Pt was sent to [facility] for medical clearance. Pt spent the night in the [facility] without incident. Pt stated he has been using Heroin daily however his UDS was positive for cocaine and amphetamines. Pt denied the use of cocaine. Pt was in rehab for drug use in 2015. Pt stated he wants to detox. Pt will be given outpatient resources for rehab treatment centers.  Pt denies suicidal/homicidal ideation, denies auditory/visual hallucinations and does not appear to be responding to internal stimuli. Pt is stable and psychiatrically cleared for discharge.   Past Psychiatric History: Polysubstance abuse, Adjustment disorder witth mixed feelings  Risk to Self: None Risk to Others:  None Prior Inpatient Therapy:   Prior Outpatient Therapy:  Yes  Past Medical History:  Past Medical History:  Diagnosis Date  . Heroin addiction [facility])     Past Surgical History:  Procedure Laterality Date  . KNEE ARTHROSCOPY    . SHOULDER ARTHROSCOPY     Family History: History reviewed. No pertinent family history. Family Psychiatric  History: Unknown Social History:  History  Alcohol Use  . Yes    Comment: Once a year      History  Drug Use  . Types: Marijuana, Cocaine, Heroin    Comment: Heroin, Xanax. Last used  heroin today.     Social History   Social History  . Marital status: Single    Spouse name: N/A  . Number of children: N/A  . Years of education: N/A   Social History Main Topics  . Smoking status: Current Every Day Smoker    Packs/day: 0.50    Types: Cigarettes  . Smokeless tobacco: Never Used  . Alcohol use Yes     Comment: Once a year   . Drug use: Yes    Types: Marijuana, Cocaine, Heroin     Comment: Heroin, Xanax. Last used heroin today.   [name] Sexual activity: Yes    Birth control/ protection: Condom     Comment: heorin, xanax   Other Topics Concern  . None   Social History Narrative  . None   Additional Social History:    Allergies:   Allergies  Allergen Reactions  . Other Nausea And Vomiting    "Anesthesia"- unknown specific medication name    Labs:  Results for orders placed or performed during the hospital encounter of 02/17/17 (from the past 48 hour(s))  Rapid urine drug screen (hospital performed)     Status: Abnormal   Collection Time: 02/17/17 10:20 PM  Result Value Ref Range   Opiates POSITIVE (A) NONE DETECTED   Cocaine NONE DETECTED NONE DETECTED   Benzodiazepines NONE DETECTED NONE DETECTED   Amphetamines POSITIVE (A) NONE DETECTED   Tetrahydrocannabinol NONE DETECTED NONE DETECTED   Barbiturates NONE DETECTED NONE DETECTED    Comment:  DRUG SCREEN FOR MEDICAL PURPOSES ONLY.  IF CONFIRMATION IS NEEDED FOR ANY PURPOSE, NOTIFY LAB WITHIN 5 DAYS.        LOWEST DETECTABLE LIMITS FOR URINE DRUG SCREEN Drug Class       Cutoff (ng/mL) Amphetamine      1000 Barbiturate      200 Benzodiazepine   188 Tricyclics       416 Opiates          300 Cocaine          300 THC              50   Comprehensive metabolic panel     Status: Abnormal   Collection Time: 02/17/17 11:08 PM  Result Value Ref Range   Sodium 139 135 - 145 mmol/L   Potassium 4.2 3.5 - 5.1 mmol/L   Chloride 103 101 - 111 mmol/L   CO2 28 22 - 32 mmol/L   Glucose, Bld 92 65  - 99 mg/dL   BUN 23 (H) 6 - 20 mg/dL   Creatinine, Ser 0.80 0.61 - 1.24 mg/dL   Calcium 9.1 8.9 - 10.3 mg/dL   Total Protein 7.3 6.5 - 8.1 g/dL   Albumin 3.9 3.5 - 5.0 g/dL   AST 45 (H) 15 - 41 U/L   ALT 66 (H) 17 - 63 U/L   Alkaline Phosphatase 50 38 - 126 U/L   Total Bilirubin 0.5 0.3 - 1.2 mg/dL   GFR calc non Af Amer >60 >60 mL/min   GFR calc Af Amer >60 >60 mL/min    Comment: (NOTE) The eGFR has been calculated using the CKD EPI equation. This calculation has not been validated in all clinical situations. eGFR's persistently <60 mL/min signify possible Chronic Kidney Disease.    Anion gap 8 5 - 15  Ethanol     Status: None   Collection Time: 02/17/17 11:08 PM  Result Value Ref Range   Alcohol, Ethyl (B) <5 <5 mg/dL    Comment:        LOWEST DETECTABLE LIMIT FOR SERUM ALCOHOL IS 5 mg/dL FOR MEDICAL PURPOSES ONLY   Salicylate level     Status: None   Collection Time: 02/17/17 11:08 PM  Result Value Ref Range   Salicylate Lvl <6.0 2.8 - 30.0 mg/dL  Acetaminophen level     Status: Abnormal   Collection Time: 02/17/17 11:08 PM  Result Value Ref Range   Acetaminophen (Tylenol), Serum <10 (L) 10 - 30 ug/mL    Comment:        THERAPEUTIC CONCENTRATIONS VARY SIGNIFICANTLY. A RANGE OF 10-30 ug/mL MAY BE AN EFFECTIVE CONCENTRATION FOR MANY PATIENTS. HOWEVER, SOME ARE BEST TREATED AT CONCENTRATIONS OUTSIDE THIS RANGE. ACETAMINOPHEN CONCENTRATIONS >150 ug/mL AT 4 HOURS AFTER INGESTION AND >50 ug/mL AT 12 HOURS AFTER INGESTION ARE OFTEN ASSOCIATED WITH TOXIC REACTIONS.   cbc     Status: Abnormal   Collection Time: 02/17/17 11:08 PM  Result Value Ref Range   WBC 8.3 4.0 - 10.5 K/uL   RBC 4.07 (L) 4.22 - 5.81 MIL/uL   Hemoglobin 12.4 (L) 13.0 - 17.0 g/dL   HCT 36.3 (L) 39.0 - 52.0 %   MCV 89.2 78.0 - 100.0 fL   MCH 30.5 26.0 - 34.0 pg   MCHC 34.2 30.0 - 36.0 g/dL   RDW 12.7 11.5 - 15.5 %   Platelets 288 150 - 400 K/uL    No current facility-administered  medications for this encounter.    No current  outpatient prescriptions on file.    Musculoskeletal: Strength & Muscle Tone: within normal limits Gait & Station: normal Patient leans: N/A  Psychiatric Specialty Exam: Physical Exam  Constitutional: He appears well-developed and well-nourished.  HENT:  Head: Normocephalic.  Cardiovascular: Normal rate.   Respiratory: Effort normal.  GI: Soft.  Musculoskeletal: Normal range of motion.  Neurological: He is alert.  Psychiatric: His speech is normal. Thought content normal. His mood appears anxious. He is agitated. Cognition and memory are normal. He expresses impulsivity.    Review of Systems  Psychiatric/Behavioral: Positive for depression and substance abuse. Negative for hallucinations, memory loss and suicidal ideas. The patient is not nervous/anxious and does not have insomnia.   All other systems reviewed and are negative.   Blood pressure 113/69, pulse 69, temperature 97.9 F (36.6 C), temperature source Oral, resp. rate 20, height '6\' 3"'  (1.905 m), weight 83.9 kg (185 lb), SpO2 98 %.Body mass index is 23.12 kg/m.  General Appearance: Casual  Eye Contact:  Fair  Speech:  Clear and Coherent and Normal Rate  Volume:  Decreased  Mood:  Anxious, Depressed and Dysphoric  Affect:  Congruent and Depressed  Thought Process:  Coherent, Goal Directed and Linear  Orientation:  Full (Time, Place, and Person)  Thought Content:  Logical  Suicidal Thoughts:  No  Homicidal Thoughts:  No  Memory:  Immediate;   Good Recent;   Good Remote;   Fair  Judgement:  Fair  Insight:  Fair  Psychomotor Activity:  Normal  Concentration:  Concentration: Fair and Attention Span: Fair  Recall:  Good  Fund of Knowledge:  Good  Language:  Good  Akathisia:  No  Handed:  Right  AIMS (if indicated):     Assets:  Communication Skills Desire for Improvement Financial Resources/Insurance Housing Physical Health Resilience Social Support  ADL's:   Intact  Cognition:  WNL  Sleep:        Treatment Plan Summary: Plan Discharge Home  Follow up with outpatient resources provided to you to find a drug treatment center for substance abuse treatment Follow up with PCP for any new or existing medical concerns Avoid the use of alcohol and drugs  Disposition: No evidence of imminent risk to self or others at present.   Patient does not meet criteria for psychiatric inpatient admission. Discussed crisis plan, support from social network, calling 911, coming to the Emergency Department, and calling Suicide Hotline.  Ethelene Hal, NP 02/18/2017 1:52 PM  Patient seen face-to-face for psychiatric evaluation, chart reviewed and case discussed with the physician extender and developed treatment plan. Reviewed the information documented and agree with the treatment plan. Corena Pilgrim, MD

## 2017-02-18 NOTE — ED Notes (Signed)
Patient admitted to the unit. Reports back pain of 3/10. Stated "I am too tired". Denies active SI/HI, AH/VH at this time.

## 2017-02-18 NOTE — Discharge Instructions (Signed)
To help you maintain a sober lifestyle, a substance abuse treatment program may be beneficial to you.  Contact [facility] at your earliest opportunity to ask about enrolling in their program: ° °     [facility] °     [address] °     [place], [place] [address] °     [phone] °     New patients are seen at the walk-in clinic every Tuesday from 9:00 am - 12:00 pm. °

## 2017-02-18 NOTE — [facility] Suicide Risk Assessment (Signed)
Suicide Risk Assessment  Discharge Assessment   [facility]   Principal Problem: Adjustment disorder with mixed emotional features Discharge Diagnoses:  Patient Active Problem List   Diagnosis Date Noted  . 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 Adjustment disorder with mixed emotional features [F43.29] 02/18/2017    Total Time spent with patient: 30 minutes  Musculoskeletal: Strength & Muscle Tone: within normal limits Gait & Station: normal Patient leans: N/A Psychiatric Specialty Exam: Physical Exam  Constitutional: He appears well-developed and well-nourished.  HENT:  Head: Normocephalic.  Cardiovascular: Normal rate.   Respiratory: Effort normal.  GI: Soft.  Musculoskeletal: Normal range of motion.  Neurological: He is alert.  Psychiatric: His speech is normal. Thought content normal. His mood appears anxious. He is agitated. Cognition and memory are normal. He expresses impulsivity.    Review of Systems  Psychiatric/Behavioral: Positive for depression and substance abuse. Negative for hallucinations, memory loss and suicidal ideas. The patient is not nervous/anxious and does not have insomnia.   All other systems reviewed and are negative.   Blood pressure 113/69, pulse 69, temperature 97.9 F (36.6 C), temperature source Oral, resp. rate 20, height 6\' 3"  (1.905 m), weight 83.9 kg (185 lb), SpO2 98 %.Body mass index is 23.12 kg/m.  General Appearance: Casual  Eye Contact:  Fair  Speech:  Clear and Coherent and Normal Rate  Volume:  Decreased  Mood:  Anxious, Depressed and Dysphoric  Affect:  Congruent and Depressed  Thought Process:  Coherent, Goal Directed and Linear  Orientation:  Full (Time, Place, and Person)  Thought Content:  Logical  Suicidal Thoughts:  No  Homicidal Thoughts:  No  Memory:  Immediate;   Good Recent;   Good Remote;   Fair  Judgement:  Fair  Insight:  Fair  Psychomotor Activity:  Normal   Concentration:  Concentration: Fair and Attention Span: Fair  Recall:  Good  Fund of Knowledge:  Good  Language:  Good  Akathisia:  No  Handed:  Right  AIMS (if indicated):     Assets:  Communication Skills Desire for Improvement Financial Resources/Insurance Housing Physical Health Resilience Social Support  ADL's:  Intact  Cognition:  WNL  Sleep:       Demographic Factors:  Male, Low socioeconomic status and Unemployed  Loss Factors: Financial problems/change in socioeconomic status  Historical Factors: Prior suicide attempts and Impulsivity  Risk Reduction Factors:   Responsible for children under 25 years of age and Living with another person, especially a relative  Continued Clinical Symptoms:  Depression:   Impulsivity Alcohol/Substance Abuse/Dependencies  Cognitive Features That Contribute To Risk:  None    Suicide Risk:  Minimal: No identifiable suicidal ideation.  Patients presenting with no risk factors but with morbid ruminations; may be classified as minimal risk based on the severity of the depressive symptoms    Plan Of Care/Follow-up recommendations:  Activity:  as tolerated Diet:  Heart healthy  Laveda AbbeLaurie Britton Sahej Hauswirth, NP 02/18/2017, 2:13 PM

## 2017-02-18 NOTE — ED Notes (Signed)
Pt d/c home per MD order. Denies SI/HI/AVH. Discharge summary reviewed with pt. Pt verbalizes understanding. Pt signed for personal property and property returned. Pt signed e-signature. Ambulatory off unit with [facility].

## 2017-02-18 NOTE — Progress Notes (Signed)
02/18/17 1356:  LRT went to pt room to offer activities, pt declined.  Caroll RancherMarjette Marie Borowski, LRT/CTRS

## 2018-09-26 ENCOUNTER — Encounter (HOSPITAL_COMMUNITY): Payer: Self-pay | Admitting: Emergency Medicine

## 2018-09-26 ENCOUNTER — Emergency Department (HOSPITAL_COMMUNITY)
Admission: EM | Admit: 2018-09-26 | Discharge: 2018-09-26 | Disposition: A | Discharge status: Home / Self Care | Payer: Self-pay | Attending: Emergency Medicine | Admitting: Emergency Medicine

## 2018-09-26 DIAGNOSIS — F17210 Nicotine dependence, cigarettes, uncomplicated: Secondary | ICD-10-CM | POA: Insufficient documentation

## 2018-09-26 DIAGNOSIS — Z733 Stress, not elsewhere classified: Secondary | ICD-10-CM | POA: Insufficient documentation

## 2018-09-26 DIAGNOSIS — B029 Zoster without complications: Secondary | ICD-10-CM | POA: Insufficient documentation

## 2018-09-26 MED ORDER — TRAMADOL HCL 50 MG PO TABS: 50.0000 mg | ORAL_TABLET | Freq: Four times a day (QID) | ORAL | 0 refills | Status: AC | PRN

## 2018-09-26 MED ORDER — OXYCODONE-ACETAMINOPHEN 5-325 MG PO TABS
1.0000 | ORAL_TABLET | Freq: Once | ORAL | Status: AC
  Administered 2018-09-26: 1 via ORAL
  Filled 2018-09-26: qty 1

## 2018-09-26 MED ORDER — ACYCLOVIR 800 MG PO TABS
800.0000 mg | ORAL_TABLET | Freq: Once | ORAL | Status: AC
  Administered 2018-09-26: 800 mg via ORAL
  Filled 2018-09-26 (×2): qty 1

## 2018-09-26 MED ORDER — NAPROXEN 500 MG PO TABS: 500.0000 mg | ORAL_TABLET | Freq: Two times a day (BID) | ORAL | 0 refills | Status: AC

## 2018-09-26 MED ORDER — ACYCLOVIR 800 MG PO TABS: 800.0000 mg | ORAL_TABLET | Freq: Every day | ORAL | 0 refills | Status: AC

## 2018-09-26 NOTE — Discharge Instructions (Signed)
Please read and follow all provided instructions.  Your diagnoses today include:  1. Herpes zoster without complication    Tests performed today include:  Vital signs. See below for your results today.   Medications prescribed:   Acyclovir - antiviral medication for shingles  Tramadol - narcotic-like pain medication  DO NOT drive or perform any activities that require you to be awake and alert because this medicine can make you drowsy.    Naproxen - anti-inflammatory pain medication  Do not exceed 500mg  naproxen every 12 hours, take with food  You have been prescribed an anti-inflammatory medication or NSAID. Take with food. Take smallest effective dose for the shortest duration needed for your pain. Stop taking if you experience stomach pain or vomiting.   Take any prescribed medications only as directed.  Home care instructions:  Follow any educational materials contained in this packet.  BE VERY CAREFUL not to take multiple medicines containing Tylenol (also called acetaminophen). Doing so can lead to an overdose which can damage your liver and cause liver failure and possibly death.   Follow-up instructions: Please follow-up with your primary care provider in the next 3 days for further evaluation of your symptoms.   Return instructions:   Please return to the Emergency Department if you experience worsening symptoms.   Please return if you have any other emergent concerns.  Additional Information:  Your vital signs today were: BP 116/63    Pulse 65    Resp 16    Ht 6\' 3"  (1.905 m)    Wt 81.6 kg    SpO2 99%    BMI 22.50 kg/m  If your blood pressure (BP) was elevated above 135/85 this visit, please have this repeated by your doctor within one month. --------------

## 2018-09-26 NOTE — ED Triage Notes (Signed)
Reports rash to left arm and left chest.  Noticed about 5 days ago.  Reports having some pain in area prior to rash coming up.

## 2018-09-26 NOTE — ED Provider Notes (Signed)
[facility] EMERGENCY DEPARTMENT Provider Note   CSN: [id number] Arrival date & time: 09/26/18  0442     History   Chief Complaint Chief Complaint  Patient presents with  . Rash    HPI James Crane is a 28 y.o. male.  Patient with history of polysubstance abuse presents the emergency department with complaint of red bumpy rash that is burning and painful to the left arm extending from the chest to the wrist.  Symptoms started 5 days ago.  Prior to rash appearing, patient felt pain in the area.  He has not had any fevers, nausea, vomiting.  He states that he is under a lot of stress.  He reports having chickenpox as a child.  No previous episodes of shingles.  No known sick contacts.  He has been applying calamine lotion without relief.     Past Medical History:  Diagnosis Date  . Heroin addiction [facility])     Patient Active Problem List   Diagnosis Date Noted  . 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 Adjustment disorder with mixed emotional features 02/18/2017    Past Surgical History:  Procedure Laterality Date  . KNEE ARTHROSCOPY    . SHOULDER ARTHROSCOPY          Home Medications    Prior to Admission medications   Not on File    Family History No family history on file.  Social History Social History   Tobacco Use  . Smoking status: Current Every Day Smoker    Packs/day: 0.50    Types: Cigarettes  . Smokeless tobacco: Never Used  Substance Use Topics  . Alcohol use: Yes    Comment: Once a year   . Drug use: Yes    Types: Marijuana, Cocaine, Heroin    Comment: Heroin, Xanax. Last used heroin today.      Allergies   Other   Review of Systems Review of Systems  Constitutional: Negative for fever.  Gastrointestinal: Negative for nausea and vomiting.  Musculoskeletal: Negative for myalgias.  Skin: Positive for rash. Negative for wound.  Neurological: Negative for weakness, numbness and headaches.      Physical Exam Updated Vital Signs BP 116/63   Pulse 65   Resp 16   Ht 6\' 3"  (1.905 m)   Wt 81.6 kg   SpO2 99%   BMI 22.50 kg/m   Physical Exam Vitals signs and nursing note reviewed.  Constitutional:      Appearance: He is well-developed.  HENT:     Head: Normocephalic and atraumatic.  Eyes:     Conjunctiva/sclera: Conjunctivae normal.  Neck:     Musculoskeletal: Normal range of motion and neck supple.  Pulmonary:     Effort: No respiratory distress.  Skin:    General: Skin is warm and dry.     Comments: Erythematous vesicular rash extending from the left pectoral area and a dermal distribution down the left arm to the wrist.  No active drainage.  Consistent with shingles.  It does not cross the midline.  Neurological:     Mental Status: He is alert.      ED Treatments / Results  Labs (all labs ordered are listed, but only abnormal results are displayed) Labs Reviewed - No data to display  EKG None  Radiology No results found.  Procedures Procedures (including critical care time)  Medications Ordered in ED Medications  acyclovir (ZOVIRAX) tablet 800 mg (has no administration in time range)  oxyCODONE-acetaminophen (PERCOCET/ROXICET) 5-325 MG per  tablet 1 tablet (has no administration in time range)     Initial Impression / Assessment and Plan / ED Course  I have reviewed the triage vital signs and the nursing notes.  Pertinent labs & imaging results that were available during my care of the patient were reviewed by me and considered in my medical decision making (see chart for details).     Patient seen and examined.  Vital signs reviewed and are as follows: BP 116/63   Pulse 65   Resp 16   Ht 6\' 3"  (1.905 m)   Wt 81.6 kg   SpO2 99%   BMI 22.50 kg/m   Rash consistent with shingles.  Encouraged him to continue calamine lotion.  Will give a Percocet here, tramadol, acyclovir, naproxen for home.  Discussed typical course of herpes zoster.    Final Clinical Impressions(s) / ED Diagnoses   Final diagnoses:  Herpes zoster without complication   Patient with shingles rash.  No apparent complication.  ED Discharge Orders         Ordered    acyclovir (ZOVIRAX) 800 MG tablet  5 times daily     09/26/18 0654    traMADol (ULTRAM) 50 MG tablet  Every 6 hours PRN     09/26/18 0654    naproxen (NAPROSYN) 500 MG tablet  2 times daily     09/26/18 0654           Renne CriglerGeiple, Nyeisha Goodall, PA-C 09/26/18 0700    Palumbo, April, MD 09/26/18 [phone]

## 2018-09-26 NOTE — ED Notes (Signed)
Pt verbalized understanding of dc instructions, vss, ambulatory out of ED with nad.

## 2019-04-26 ENCOUNTER — Other Ambulatory Visit: Payer: Self-pay

## 2019-04-26 DIAGNOSIS — F17210 Nicotine dependence, cigarettes, uncomplicated: Secondary | ICD-10-CM | POA: Diagnosis present

## 2019-04-26 DIAGNOSIS — Z5329 Procedure and treatment not carried out because of patient's decision for other reasons: Secondary | ICD-10-CM | POA: Diagnosis present

## 2019-04-26 DIAGNOSIS — M25561 Pain in right knee: Secondary | ICD-10-CM | POA: Diagnosis present

## 2019-04-26 DIAGNOSIS — M25461 Effusion, right knee: Principal | ICD-10-CM | POA: Diagnosis present

## 2019-04-26 DIAGNOSIS — F1110 Opioid abuse, uncomplicated: Secondary | ICD-10-CM | POA: Diagnosis present

## 2019-04-26 DIAGNOSIS — Z20828 Contact with and (suspected) exposure to other viral communicable diseases: Secondary | ICD-10-CM | POA: Diagnosis present

## 2019-04-27 ENCOUNTER — Other Ambulatory Visit: Payer: Self-pay

## 2019-04-27 ENCOUNTER — Encounter (HOSPITAL_COMMUNITY): Payer: Self-pay | Admitting: Family Medicine

## 2019-04-27 ENCOUNTER — Inpatient Hospital Stay (HOSPITAL_COMMUNITY)
Admission: EM | Admit: 2019-04-27 | Discharge: 2019-04-30 | DRG: 566 | Discharge status: Against Medical Advice | Payer: Self-pay | Attending: Internal Medicine | Admitting: Internal Medicine

## 2019-04-27 DIAGNOSIS — D72825 Bandemia: Secondary | ICD-10-CM

## 2019-04-27 DIAGNOSIS — M25561 Pain in right knee: Secondary | ICD-10-CM | POA: Diagnosis present

## 2019-04-27 DIAGNOSIS — F1990 Other psychoactive substance use, unspecified, uncomplicated: Secondary | ICD-10-CM

## 2019-04-27 DIAGNOSIS — D72829 Elevated white blood cell count, unspecified: Secondary | ICD-10-CM

## 2019-04-27 DIAGNOSIS — M25461 Effusion, right knee: Secondary | ICD-10-CM | POA: Diagnosis present

## 2019-04-27 DIAGNOSIS — F17200 Nicotine dependence, unspecified, uncomplicated: Secondary | ICD-10-CM

## 2019-04-27 DIAGNOSIS — F1910 Other psychoactive substance abuse, uncomplicated: Secondary | ICD-10-CM

## 2019-04-27 LAB — SYNOVIAL CELL COUNT + DIFF, W/ CRYSTALS
Crystals, Fluid: NONE SEEN
Eosinophils-Synovial: 0 % (ref 0–1)
Lymphocytes-Synovial Fld: 10 % (ref 0–20)
Monocyte-Macrophage-Synovial Fluid: 75 % (ref 50–90)
Neutrophil, Synovial: 15 % (ref 0–25)
WBC, Synovial: 275 /mm3 — ABNORMAL HIGH (ref 0–200)

## 2019-04-27 LAB — CBC WITH DIFFERENTIAL/PLATELET
Abs Immature Granulocytes: 0.11 10*3/uL — ABNORMAL HIGH (ref 0.00–0.07)
Basophils Absolute: 0.1 10*3/uL (ref 0.0–0.1)
Basophils Relative: 0 %
Eosinophils Absolute: 0.3 10*3/uL (ref 0.0–0.5)
Eosinophils Relative: 1 %
HCT: 39.6 % (ref 39.0–52.0)
Hemoglobin: 13.2 g/dL (ref 13.0–17.0)
Immature Granulocytes: 1 %
Lymphocytes Relative: 20 %
Lymphs Abs: 4.4 10*3/uL — ABNORMAL HIGH (ref 0.7–4.0)
MCH: 29 pg (ref 26.0–34.0)
MCHC: 33.3 g/dL (ref 30.0–36.0)
MCV: 87 fL (ref 80.0–100.0)
Monocytes Absolute: 1.8 10*3/uL — ABNORMAL HIGH (ref 0.1–1.0)
Monocytes Relative: 9 %
Neutro Abs: 14.8 10*3/uL — ABNORMAL HIGH (ref 1.7–7.7)
Neutrophils Relative %: 69 %
Platelets: 500 10*3/uL — ABNORMAL HIGH (ref 150–400)
RBC: 4.55 MIL/uL (ref 4.22–5.81)
RDW: 13.3 % (ref 11.5–15.5)
WBC: 21.5 10*3/uL — ABNORMAL HIGH (ref 4.0–10.5)
nRBC: 0 % (ref 0.0–0.2)

## 2019-04-27 LAB — COMPREHENSIVE METABOLIC PANEL
ALT: 28 U/L (ref 0–44)
AST: 25 U/L (ref 15–41)
Albumin: 4 g/dL (ref 3.5–5.0)
Alkaline Phosphatase: 50 U/L (ref 38–126)
Anion gap: 13 (ref 5–15)
BUN: 16 mg/dL (ref 6–20)
CO2: 22 mmol/L (ref 22–32)
Calcium: 9.2 mg/dL (ref 8.9–10.3)
Chloride: 101 mmol/L (ref 98–111)
Creatinine, Ser: 0.81 mg/dL (ref 0.61–1.24)
GFR calc Af Amer: >60 mL/min (ref >60)
GFR calc non Af Amer: >60 mL/min (ref >60)
Glucose, Bld: 103 mg/dL — ABNORMAL HIGH (ref 70–99)
Potassium: 3.6 mmol/L (ref 3.5–5.1)
Sodium: 136 mmol/L (ref 135–145)
Total Bilirubin: 0.7 mg/dL (ref 0.3–1.2)
Total Protein: 8.1 g/dL (ref 6.5–8.1)

## 2019-04-27 LAB — RAPID URINE DRUG SCREEN, HOSP PERFORMED
Amphetamines: POSITIVE — AB
Barbiturates: NOT DETECTED
Benzodiazepines: NOT DETECTED
Cocaine: NOT DETECTED
Opiates: POSITIVE — AB
Tetrahydrocannabinol: NOT DETECTED

## 2019-04-27 LAB — SEDIMENTATION RATE: Sed Rate: 28 mm/hr — ABNORMAL HIGH (ref 0–16)

## 2019-04-27 LAB — LACTIC ACID, PLASMA
Lactic Acid, Venous: 1.7 mmol/L (ref 0.5–1.9)
Lactic Acid, Venous: 0.6 mmol/L (ref 0.5–1.9)

## 2019-04-27 MED ORDER — ACETAMINOPHEN 650 MG RE SUPP: 650.0000 mg | Freq: Four times a day (QID) | RECTAL | Status: DC | PRN

## 2019-04-27 MED ORDER — ACETAMINOPHEN 325 MG PO TABS
650.0000 mg | ORAL_TABLET | Freq: Four times a day (QID) | ORAL | Status: DC | PRN
  Administered 2019-04-28 – 2019-04-30 (×3): 650 mg via ORAL
  Filled 2019-04-27 (×3): qty 2

## 2019-04-27 MED ORDER — HYDROMORPHONE HCL 1 MG/ML IJ SOLN
1.0000 mg | Freq: Once | INTRAMUSCULAR | Status: AC
  Administered 2019-04-27: 1 mg via INTRAVENOUS
  Filled 2019-04-27: qty 1

## 2019-04-27 MED ORDER — VANCOMYCIN HCL 10 G IV SOLR
1250.0000 mg | Freq: Three times a day (TID) | INTRAVENOUS | Status: DC
  Administered 2019-04-27 – 2019-04-29 (×8): 1250 mg via INTRAVENOUS
  Filled 2019-04-27 (×10): qty 1250

## 2019-04-27 MED ORDER — HYDROMORPHONE HCL 1 MG/ML IJ SOLN
0.5000 mg | INTRAMUSCULAR | Status: DC | PRN
  Administered 2019-04-27 – 2019-04-29 (×11): 0.5 mg via INTRAVENOUS
  Filled 2019-04-27 (×3): qty 0.5
  Filled 2019-04-27: qty 1
  Filled 2019-04-27 (×2): qty 0.5
  Filled 2019-04-27: qty 1
  Filled 2019-04-27 (×5): qty 0.5

## 2019-04-27 MED ORDER — FLEET ENEMA 7-19 GM/118ML RE ENEM: 1.0000 | ENEMA | Freq: Once | RECTAL | Status: DC | PRN

## 2019-04-27 MED ORDER — ONDANSETRON HCL 4 MG PO TABS: 4.0000 mg | ORAL_TABLET | Freq: Four times a day (QID) | ORAL | Status: DC | PRN

## 2019-04-27 MED ORDER — SENNA 8.6 MG PO TABS
1.0000 | ORAL_TABLET | Freq: Two times a day (BID) | ORAL | Status: DC
  Administered 2019-04-28: 8.6 mg via ORAL
  Filled 2019-04-27 (×5): qty 1

## 2019-04-27 MED ORDER — ONDANSETRON HCL 4 MG/2ML IJ SOLN
4.0000 mg | Freq: Four times a day (QID) | INTRAMUSCULAR | Status: DC | PRN
  Filled 2019-04-27: qty 2

## 2019-04-27 MED ORDER — ZOLPIDEM TARTRATE 5 MG PO TABS: 5.0000 mg | ORAL_TABLET | Freq: Every evening | ORAL | Status: DC | PRN

## 2019-04-27 MED ORDER — VANCOMYCIN HCL IN DEXTROSE 1-5 GM/200ML-% IV SOLN
1000.0000 mg | Freq: Once | INTRAVENOUS | Status: AC
  Administered 2019-04-27: 1000 mg via INTRAVENOUS
  Filled 2019-04-27: qty 200

## 2019-04-27 MED ORDER — SENNOSIDES-DOCUSATE SODIUM 8.6-50 MG PO TABS: 1.0000 | ORAL_TABLET | Freq: Every evening | ORAL | Status: DC | PRN

## 2019-04-27 MED ORDER — SODIUM CHLORIDE 0.9 % IV BOLUS
1000.0000 mL | Freq: Once | INTRAVENOUS | Status: AC
  Administered 2019-04-27: 04:00:00 1000 mL via INTRAVENOUS

## 2019-04-27 MED ORDER — OXYCODONE HCL 5 MG PO TABS
5.0000 mg | ORAL_TABLET | ORAL | Status: DC | PRN
  Administered 2019-04-27 – 2019-04-30 (×11): 5 mg via ORAL
  Filled 2019-04-27 (×11): qty 1

## 2019-04-27 MED ORDER — BISACODYL 5 MG PO TBEC: 5.0000 mg | DELAYED_RELEASE_TABLET | Freq: Every day | ORAL | Status: DC | PRN

## 2019-04-27 MED ORDER — LIDOCAINE HCL (PF) 1 % IJ SOLN
5.0000 mL | Freq: Once | INTRAMUSCULAR | Status: AC
  Administered 2019-04-27: 5 mL
  Filled 2019-04-27: qty 30

## 2019-04-27 NOTE — ED Notes (Signed)
Second set of blood cultures obtained prior to starting vancomycin.

## 2019-04-27 NOTE — ED Provider Notes (Signed)
[facility]-EMERGENCY DEPT Provider Note   CSN: [id number] Arrival date & time: 04/26/19  2354    History   Chief Complaint Chief Complaint  Patient presents with  . Joint Swelling    HPI James Crane is a 28 y.o. male.   The history is provided by the patient.  He has history of IV heroin abuse and comes in because pain and swelling of the right knee.  He noted that it was painful and swollen about 1 week ago, but it got much worse today.  He noticed it was red today.  He denies fever but has had some chills and sweats.  Pain is severe and he rates it at 10/10.  It is worse with any kind of movement or palpation.  He denies any trauma.  He has had 2 surgeries on that knee.  Past Medical History:  Diagnosis Date  . Heroin addiction [facility])     Patient Active Problem List   Diagnosis Date Noted  . 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 Adjustment disorder with mixed emotional features 02/18/2017    Past Surgical History:  Procedure Laterality Date  . KNEE ARTHROSCOPY    . SHOULDER ARTHROSCOPY          Home Medications    Prior to Admission medications   Medication Sig Start Date End Date Taking? Authorizing Provider  acyclovir (ZOVIRAX) 800 MG tablet Take 1 tablet (800 mg total) by mouth 5 (five) times daily. Patient not taking: Reported on 04/27/2019 09/26/18   Renne Crigler, PA-C  naproxen (NAPROSYN) 500 MG tablet Take 1 tablet (500 mg total) by mouth 2 (two) times daily. Patient not taking: Reported on 04/27/2019 09/26/18   Renne Crigler, PA-C  traMADol (ULTRAM) 50 MG tablet Take 1 tablet (50 mg total) by mouth every 6 (six) hours as needed. Patient not taking: Reported on 04/27/2019 09/26/18   Renne Crigler, PA-C    Family History History reviewed. No pertinent family history.  Social History Social History   Tobacco Use  . Smoking status: Current Every Day Smoker    Packs/day: 0.50    Types: Cigarettes  . Smokeless tobacco:  Never Used  Substance Use Topics  . Alcohol use: Yes    Comment: Once a year   . Drug use: Yes    Types: Marijuana, Cocaine, Heroin    Comment: Last used yesterday: Heroin. denies cocaine and marijuana.      Allergies   Other   Review of Systems Review of Systems  All other systems reviewed and are negative.    Physical Exam Updated Vital Signs BP 105/71 (BP Location: Right Arm)   Pulse (!) 120   Temp 99 F (37.2 C) (Oral)   Resp 18   Ht 6\' 3"  (1.905 m)   Wt 79.4 kg   SpO2 100%   BMI 21.87 kg/m   Physical Exam Vitals signs and nursing note reviewed.    28 year old male, in obvious pain, but in no acute distress. Vital signs are significant for rapid heart rate. Oxygen saturation is 100%, which is normal. Head is normocephalic and atraumatic. PERRLA, EOMI. Oropharynx is clear. Neck is nontender and supple without adenopathy or JVD. Back is nontender and there is no CVA tenderness. Lungs are clear without rales, wheezes, or rhonchi. Chest is nontender. Heart has regular rate and rhythm without murmur. Abdomen is soft, flat, nontender without masses or hepatosplenomegaly and peristalsis is normoactive. Extremities: Marked swelling of the right knee with  effusion present.  Moderate overlying erythema and warmth.  IV track marks are noted. Skin is warm and dry without rash. Neurologic: Mental status is normal, cranial nerves are intact, there are no motor or sensory deficits.  ED Treatments / Results  Labs (all labs ordered are listed, but only abnormal results are displayed) Labs Reviewed  CBC WITH DIFFERENTIAL/PLATELET - Abnormal; Notable for the following components:      Result Value   WBC 21.5 (*)    Platelets 500 (*)    Neutro Abs 14.8 (*)    Lymphs Abs 4.4 (*)    Monocytes Absolute 1.8 (*)    Abs Immature Granulocytes 0.11 (*)    All other components within normal limits  COMPREHENSIVE METABOLIC PANEL - Abnormal; Notable for the following components:    Glucose, Bld 103 (*)    All other components within normal limits  SYNOVIAL CELL COUNT + DIFF, W/ CRYSTALS - Abnormal; Notable for the following components:   Color, Synovial YELLOW (*)    Appearance-Synovial HAZY (*)    WBC, Synovial 275 (*)    All other components within normal limits  CULTURE, BLOOD (ROUTINE X 2)  CULTURE, BLOOD (ROUTINE X 2)  BODY FLUID CULTURE  LACTIC ACID, PLASMA  LACTIC ACID, PLASMA  GLUCOSE, BODY FLUID OTHER  RAPID URINE DRUG SCREEN, HOSP PERFORMED   Procedures .Joint Aspiration/Arthrocentesis  Date/Time: 04/27/2019 4:03 AM Performed by: Dione BoozeGlick, Shamone Winzer, MD Authorized by: Dione BoozeGlick, Farra Nikolic, MD   Consent:    Consent obtained:  Verbal   Consent given by:  Patient   Risks discussed:  Bleeding, infection, pain and incomplete drainage   Alternatives discussed:  No treatment Location:    Location:  Knee   Knee:  R knee Anesthesia (see MAR for exact dosages):    Anesthesia method:  Local infiltration   Local anesthetic:  Lidocaine 1% w/o epi Procedure details:    Preparation: Patient was prepped and draped in usual sterile fashion     Needle gauge:  18 G   Ultrasound guidance: no     Approach:  Medial   Aspirate amount:  10 ml   Aspirate characteristics:  Yellow   Steroid injected: no     Specimen collected: yes   Post-procedure details:    Dressing:  Sterile dressing   Patient tolerance of procedure:  Tolerated well, no immediate complications     Medications Ordered in ED Medications  HYDROmorphone (DILAUDID) injection 1 mg (1 mg Intravenous Given 04/27/19 0356)  lidocaine (PF) (XYLOCAINE) 1 % injection 5 mL (5 mLs Infiltration Given 04/27/19 0355)  sodium chloride 0.9 % bolus 1,000 mL (0 mLs Intravenous Stopped 04/27/19 0502)  vancomycin (VANCOCIN) IVPB 1000 mg/200 mL premix (1,000 mg Intravenous New Bag/Given 04/27/19 0629)     Initial Impression / Assessment and Plan / ED Course  I have reviewed the triage vital signs and the nursing notes.   Pertinent labs & imaging results that were available during my care of the patient were reviewed by me and considered in my medical decision making (see chart for details).  Right knee pain and swelling worrisome for septic arthritis.  Old records are reviewed confirming multiple ED visits for substance abuse.  Will do arthrocentesis and start on antibiotics if fluid is suggestive of infection.  Only 10 mL was able to be aspirated from the joint and fluid appeared clear.  We will hold off on antibiotics until I get a cell count report and a Gram stain report.  Cell count  is only 275, so I do not believe this is septic arthritis.  However, there is clearly some infectious process going on around the knee.  Suspect synovitis versus cellulitis.  WBC is markedly elevated at 21.5.  In light of this, he is started on vancomycin as an antibiotic.  Case is discussed with Dr. Cyndia Skeeters of [facility] hospitalist who agrees to admit the patient.  Final Clinical Impressions(s) / ED Diagnoses   Final diagnoses:  Pain and swelling of right knee  Leukocytosis, unspecified type  IV drug abuse [facility])    ED Discharge Orders    None       Delora Fuel, MD 73/53/29 0730

## 2019-04-27 NOTE — ED Notes (Signed)
Attempted to get blood x 2

## 2019-04-27 NOTE — ED Notes (Signed)
ED TO INPATIENT HANDOFF REPORT  ED Nurse Name and Phone #: Sharene SkeansJeneen Brinkley Peet [phone]  S Name/Age/Gender James DyNicholas G Varnum 28 y.o. male Room/Bed: WA11/WA11  Code Status   Code Status: Full Code  Home/SNF/Other Home Patient oriented to: self, place, time and situation Is this baseline? Yes   Triage Complete: Triage complete  Chief Complaint Right Knee Infection  Triage Note Patients right knee is swollen, red, and inflammed. Patient states this started about 1 week ago. He does use IV heroin but denies shooting up in that knee.    Allergies Allergies  Allergen Reactions  . Other Nausea And Vomiting    "Anesthesia"- unknown specific medication name    Level of Care/Admitting Diagnosis ED Disposition    ED Disposition Condition Comment   Admit  [facility]ospital [facility] [[id number]]  Level of Care: Med-Surg [16]  Covid Evaluation: N/A  Diagnosis: Pain and swelling of knee, right [[id number]]  Admitting Physician: Almon HerculesGONFA, TAYE T [[id number]]  Attending Physician: Almon HerculesGONFA, TAYE T [[id number]]  PT Class (Do Not Modify): Observation [104]  PT Acc Code (Do Not Modify): Observation [10022]       B Medical/Surgery History Past Medical History:  Diagnosis Date  . Heroin addiction [facility](HCC)    Past Surgical History:  Procedure Laterality Date  . KNEE ARTHROSCOPY    . SHOULDER ARTHROSCOPY       A IV Location/Drains/Wounds Patient Lines/Drains/Airways Status   Active Line/Drains/Airways    Name:   Placement date:   Placement time:   Site:   Days:   Peripheral IV 04/27/19 Right Antecubital   04/27/19    0353    Antecubital   less than 1   Peripheral IV 04/27/19 Left;Anterior Forearm   04/27/19    0643    Forearm   less than 1          Intake/Output Last 24 hours  Intake/Output Summary (Last 24 hours) at 04/27/2019 1310 Last data filed at 04/27/2019 0502 Gross per 24 hour  Intake 990 ml  Output -  Net 990 ml    Labs/Imaging Results for orders placed or  performed during the hospital encounter of 04/27/19 (from the past 48 hour(s))  CBC with Differential     Status: Abnormal   Collection Time: 04/27/19  4:18 AM  Result Value Ref Range   WBC 21.5 (H) 4.0 - 10.5 K/uL   RBC 4.55 4.22 - 5.81 MIL/uL   Hemoglobin 13.2 13.0 - 17.0 g/dL   HCT 84.639.6 96.239.0 - 95.252.0 %   MCV 87.0 80.0 - 100.0 fL   MCH 29.0 26.0 - 34.0 pg   MCHC 33.3 30.0 - 36.0 g/dL   RDW 84.113.3 32.411.5 - 40.115.5 %   Platelets 500 (H) 150 - 400 K/uL   nRBC 0.0 0.0 - 0.2 %   Neutrophils Relative % 69 %   Neutro Abs 14.8 (H) 1.7 - 7.7 K/uL   Lymphocytes Relative 20 %   Lymphs Abs 4.4 (H) 0.7 - 4.0 K/uL   Monocytes Relative 9 %   Monocytes Absolute 1.8 (H) 0.1 - 1.0 K/uL   Eosinophils Relative 1 %   Eosinophils Absolute 0.3 0.0 - 0.5 K/uL   Basophils Relative 0 %   Basophils Absolute 0.1 0.0 - 0.1 K/uL   Immature Granulocytes 1 %   Abs Immature Granulocytes 0.11 (H) 0.00 - 0.07 K/uL    Comment: Performed at [facility], [address] [place], [place] [address]  Lactic acid, plasma  Status: None   Collection Time: 04/27/19  4:18 AM  Result Value Ref Range   Lactic Acid, Venous 1.7 0.5 - 1.9 mmol/L    Comment: Performed at [facility], [address] [place], [place] [address]  Comprehensive metabolic panel     Status: Abnormal   Collection Time: 04/27/19  4:18 AM  Result Value Ref Range   Sodium 136 135 - 145 mmol/L   Potassium 3.6 3.5 - 5.1 mmol/L   Chloride 101 98 - 111 mmol/L   CO2 22 22 - 32 mmol/L   Glucose, Bld 103 (H) 70 - 99 mg/dL   BUN 16 6 - 20 mg/dL   Creatinine, Ser 6.040.81 0.61 - 1.24 mg/dL   Calcium 9.2 8.9 - 54.010.3 mg/dL   Total Protein 8.1 6.5 - 8.1 g/dL   Albumin 4.0 3.5 - 5.0 g/dL   AST 25 15 - 41 U/L   ALT 28 0 - 44 U/L   Alkaline Phosphatase 50 38 - 126 U/L   Total Bilirubin 0.7 0.3 - 1.2 mg/dL   GFR calc non Af Amer >60 >60 mL/min   GFR calc Af Amer >60 >60 mL/min   Anion gap 13 5 - 15    Comment: Performed at  [facility], [address] [place], [place] [address]  Body fluid culture     Status: None (Preliminary result)   Collection Time: 04/27/19  4:18 AM   Specimen: Synovium; Body Fluid  Result Value Ref Range   Specimen Description      SYNOVIAL KNEE RIGHT Performed at [facility], [address] [place], [place] [address]    Special Requests      Normal Performed at [facility], [address] [place], [place] [address]    Gram Stain      FEW WBC PRESENT,BOTH PMN AND MONONUCLEAR NO ORGANISMS SEEN Performed at [facility], [address] [place], [place] [address]    Culture PENDING    Report Status PENDING   Synovial cell count + diff, w/ crystals     Status: Abnormal   Collection Time: 04/27/19  4:18 AM  Result Value Ref Range   Color, Synovial YELLOW (A) YELLOW   Appearance-Synovial HAZY (A) CLEAR   Crystals, Fluid NO CRYSTALS SEEN    WBC, Synovial 275 (H) 0 - 200 /cu mm   Neutrophil, Synovial 15 0 - 25 %   Lymphocytes-Synovial Fld 10 0 - 20 %   Monocyte-Macrophage-Synovial Fluid 75 50 - 90 %   Eosinophils-Synovial 0 0 - 1 %    Comment: Performed at [facility], [address] [place], [place] [address]  Lactic acid, plasma     Status: None   Collection Time: 04/27/19  8:39 AM  Result Value Ref Range   Lactic Acid, Venous 0.6 0.5 - 1.9 mmol/L    Comment: Performed at [facility], [address] [place], [place] [address]  Rapid urine drug screen (hospital performed)     Status: Abnormal   Collection Time: 04/27/19  8:39 AM  Result Value Ref Range   Opiates POSITIVE (A) NONE DETECTED   Cocaine NONE DETECTED NONE DETECTED   Benzodiazepines NONE DETECTED NONE DETECTED   Amphetamines POSITIVE (A) NONE DETECTED   Tetrahydrocannabinol NONE DETECTED NONE DETECTED   Barbiturates NONE DETECTED NONE DETECTED    Comment: (NOTE) DRUG SCREEN FOR MEDICAL PURPOSES ONLY.  IF  CONFIRMATION IS NEEDED FOR ANY  PURPOSE, NOTIFY LAB WITHIN 5 DAYS. LOWEST DETECTABLE LIMITS FOR URINE DRUG SCREEN Drug Class                     Cutoff (ng/mL) Amphetamine and metabolites    1000 Barbiturate and metabolites    200 Benzodiazepine                 200 Tricyclics and metabolites     300 Opiates and metabolites        300 Cocaine and metabolites        300 THC                            50 Performed at [facility], [address] [place], [place] [address]   Sedimentation rate     Status: Abnormal   Collection Time: 04/27/19  8:39 AM  Result Value Ref Range   Sed Rate 28 (H) 0 - 16 mm/hr    Comment: Performed at [facility], [address] [place], [place] [address]   No results found.  Pending Labs Unresulted Labs (From admission, onward)    Start     Ordered   05/04/19 0500  Creatinine, serum  (enoxaparin (LOVENOX)    CrCl >/= 30 ml/min)  Weekly,   R    Comments: while on enoxaparin therapy    04/27/19 0935   04/28/19 0500  CBC  Tomorrow morning,   R     04/27/19 0935   04/28/19 0500  Basic metabolic panel  Tomorrow morning,   R     04/27/19 0935   04/28/19 0500  C-reactive protein  Tomorrow morning,   R     04/27/19 0756   04/27/19 1157  Novel Coronavirus, NAA (hospital order; send-out to ref lab)  Once,   STAT    Comments: No isolation needed for this testing (if isolation ordered for another indication, maintain current isolation).   Question Answer Comment  Is this test for diagnosis or screening Screening   Symptomatic for COVID-19 as defined by CDC No   Hospitalized for COVID-19 No   Admitted to ICU for COVID-19 No   Previously tested for COVID-19 No   Resident in a congregate (group) care setting No   Employed in healthcare setting No   Required for discharge to: SNF/facility placement      04/27/19 1157   04/27/19 0936  HIV antibody (Routine Testing)  Once,   STAT     04/27/19 0935   04/27/19 0936  CBC   (enoxaparin (LOVENOX)    CrCl >/= 30 ml/min)  Once,   STAT    Comments: Baseline for enoxaparin therapy IF NOT ALREADY DRAWN.  Notify MD if PLT < 100 K.    04/27/19 0935   04/27/19 0936  Creatinine, serum  (enoxaparin (LOVENOX)    CrCl >/= 30 ml/min)  Once,   STAT    Comments: Baseline for enoxaparin therapy IF NOT ALREADY DRAWN.    04/27/19 0935   04/27/19 0329  Glucose, Body Fluid Other  Once,   STAT     04/27/19 0329   04/27/19 0328  Culture, blood (routine x 2)  BLOOD CULTURE X 2,   STAT    Question:  Patient immune status  Answer:  Normal   04/27/19 0329          Vitals/Pain Today's Vitals   04/27/19 1201 04/27/19 1205 04/27/19 1206  04/27/19 1225  BP:  (!) 148/91 134/78   Pulse:  84 88   Resp:  18 20   Temp:  99.6 F (37.6 C)    TempSrc:  Oral    SpO2:  100% 100%   Weight:      Height:      PainSc: 10-Worst pain ever 5   10-Worst pain ever    Isolation Precautions No active isolations  Medications Medications  acetaminophen (TYLENOL) tablet 650 mg (has no administration in time range)    Or  acetaminophen (TYLENOL) suppository 650 mg (has no administration in time range)  oxyCODONE (Oxy IR/ROXICODONE) immediate release tablet 5 mg (has no administration in time range)  zolpidem (AMBIEN) tablet 5 mg (has no administration in time range)  senna (SENOKOT) tablet 8.6 mg (8.6 mg Oral Refused 04/27/19 1045)  senna-docusate (Senokot-S) tablet 1 tablet (has no administration in time range)  bisacodyl (DULCOLAX) EC tablet 5 mg (has no administration in time range)  sodium phosphate (FLEET) 7-19 GM/118ML enema 1 enema (has no administration in time range)  ondansetron (ZOFRAN) tablet 4 mg (has no administration in time range)    Or  ondansetron (ZOFRAN) injection 4 mg (has no administration in time range)  vancomycin (VANCOCIN) 1,250 mg in sodium chloride 0.9 % 250 mL IVPB (has no administration in time range)  HYDROmorphone (DILAUDID) injection 0.5 mg (0.5 mg Intravenous  Given 04/27/19 1205)  HYDROmorphone (DILAUDID) injection 1 mg (1 mg Intravenous Given 04/27/19 0356)  lidocaine (PF) (XYLOCAINE) 1 % injection 5 mL (5 mLs Infiltration Given 04/27/19 0355)  sodium chloride 0.9 % bolus 1,000 mL (0 mLs Intravenous Stopped 04/27/19 0502)  vancomycin (VANCOCIN) IVPB 1000 mg/200 mL premix (0 mg Intravenous Stopped 04/27/19 0817)    Mobility walks with person assist Low fall risk   Focused Assessments Gen Med      R Recommendations: See Admitting Provider Note  Report given to:   Additional Notes:

## 2019-04-27 NOTE — ED Triage Notes (Signed)
Patients right knee is swollen, red, and inflammed. Patient states this started about 1 week ago. He does use IV heroin but denies shooting up in that knee.

## 2019-04-27 NOTE — ED Notes (Signed)
Patient refused blood draw.

## 2019-04-27 NOTE — ED Notes (Signed)
Called lab to draw blood and patient refused lab

## 2019-04-27 NOTE — ED Notes (Signed)
Admitting provider at bedside.

## 2019-04-27 NOTE — Progress Notes (Signed)
Pharmacy Antibiotic Note  James Crane is a 28 y.o. male with hx substance abuse including IV heroin use presented to the ED on 04/27/2019 with c/o right knee swelling and pain.  Patient had arthrocentesis procedure performed in the ED on 9/2.  To start vancomycin for suspected knee infection/cellulitis.  - pt received vancomycin 1000 mg IV x1 in the ED at [place]: - vancomycin 1250 mg IV q8h for est AUC 470 - f/u renal function and clinical status  __________________________________  Height: 6\' 3"  (190.5 cm) Weight: 175 lb (79.4 kg) IBW/kg (Calculated) : 84.5  Temp (24hrs), Avg:99 F (37.2 C), Min:99 F (37.2 C), Max:99 F (37.2 C)  Recent Labs  Lab 04/27/19 0418  WBC 21.5*  CREATININE 0.81  LATICACIDVEN 1.7    Estimated Creatinine Clearance: 153.8 mL/min (by C-G formula based on SCr of 0.81 mg/dL).    Allergies  Allergen Reactions  . Other Nausea And Vomiting    "Anesthesia"- unknown specific medication name     Thank you for allowing pharmacy to be a part of this patient's care.  Lynelle Doctor 04/27/2019 7:41 AM

## 2019-04-27 NOTE — H&P (Signed)
History and Physical    James Crane RJP:[id number] DOB: 05-29-1991 DOA: 04/27/2019  PCP: Default, Provider, MD Patient coming from: Home  Chief Complaint: Right knee pain  HPI: James Crane is a 28 y.o. male with history of IVDU and tobacco use disorder presenting with right knee pain.  Patient reports spontaneous onset of right knee pain and swelling about 1 week ago that has acutely gotten worse over the last 24 hours and prompted him to come to ED.  He also noted redness over his right knee.  Denies history of trauma or injury.  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  Describes the pain as sharp.  Pain is on and off.  Rates his pain severity 10/10 when he arrived.  Currently pain is 9/10.  Pain is worse with movement, bending knee or bearing weight.  Alleviated by pain medication.  He denies fever, chills, URI symptoms, chest pain, dyspnea, nausea, vomiting, abdominal pain or UTI symptoms.  He denies numbness or tingling in his lower extremities.  Denies history of diabetes, hypertension or kidney disease.  Reports smoking about half a pack a day.  Denies alcohol.  Admits to injecting heroin.  Last injection was yesterday morning.  In ED, tachycardic to 120s to 130s but improved.  No fever but mild temp to 99 degrees.  CMP not impressive.  CBC with leukocytosis to 21.5 and thrombocytosis to 500.  Lactic acid 1.7.  Synovial fluid study with 275 WBCs.  Right knee x-ray without acute finding.  Blood and synovial culture sent.  Received IV vancomycin, and hospital service was called for admission.    ROS All review of system negative except for pertinent positives and negatives as history of present illness above.  PMH Past Medical History:  Diagnosis Date  . 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 KNEE ARTHROSCOPY    . SHOULDER ARTHROSCOPY     Fam HX History reviewed. No pertinent family history.  Social  Hx  reports that he has been smoking cigarettes. He has been smoking about 0.50 packs per day. He has never used smokeless tobacco. He reports current alcohol use. He reports current drug use. Drugs: Marijuana, Cocaine, and Heroin.  Allergy Allergies  Allergen Reactions  . Other Nausea And Vomiting    "Anesthesia"- unknown specific medication name   Home Meds Prior to Admission medications   Medication Sig Start Date End Date Taking? Authorizing Provider  acyclovir (ZOVIRAX) 800 MG tablet Take 1 tablet (800 mg total) by mouth 5 (five) times daily. Patient not taking: Reported on 04/27/2019 09/26/18   Carlisle Cater, PA-C  naproxen (NAPROSYN) 500 MG tablet Take 1 tablet (500 mg total) by mouth 2 (two) times daily. Patient not taking: Reported on 04/27/2019 09/26/18   Carlisle Cater, PA-C  traMADol (ULTRAM) 50 MG tablet Take 1 tablet (50 mg total) by mouth every 6 (six) hours as needed. Patient not taking: Reported on 04/27/2019 09/26/18   Carlisle Cater, PA-C    Physical Exam: Vitals:   04/27/19 0600 04/27/19 0646 04/27/19 0700 04/27/19 0730  BP: (!) 144/81 118/75 131/86 138/87  Pulse: (!) 118 (!) 102 88 90  Resp: (!) '22 11 17 18  ' Temp:      TempSrc:      SpO2: 94% 100% 98% 98%  Weight:      Height:        GENERAL: No acute distress.  Sleepy but arises  to voice. HEENT: MMM.  Vision and hearing grossly intact.  NECK: Supple.  No apparent JVD.  RESP:  No IWOB. Good air movement bilaterally. CVS:  RRR . Heart sounds normal.  ABD/GI/GU: Bowel sounds present. Soft. Non tender.  MSK/EXT:  Moves extremities.  Right knee swelling and erythema.  Tender to palpation.  No stigmata of endocarditis. SKIN: Erythema over right knee with a small wound inferiorly. NEURO: Sleepy but arises to voice.  No gross deficit.  PSYCH: Sleepy      Personally Reviewed Radiological Exams No results found.   Personally Reviewed Labs: CBC: Recent Labs  Lab 04/27/19 0418  WBC 21.5*  NEUTROABS 14.8*  HGB  13.2  HCT 39.6  MCV 87.0  PLT 017*   Basic Metabolic Panel: Recent Labs  Lab 04/27/19 0418  NA 136  K 3.6  CL 101  CO2 22  GLUCOSE 103*  BUN 16  CREATININE 0.81  CALCIUM 9.2   GFR: Estimated Creatinine Clearance: 153.8 mL/min (by C-G formula based on SCr of 0.81 mg/dL). Liver Function Tests: Recent Labs  Lab 04/27/19 0418  AST 25  ALT 28  ALKPHOS 50  BILITOT 0.7  PROT 8.1  ALBUMIN 4.0   No results for input(s): LIPASE, AMYLASE in the last 168 hours. No results for input(s): AMMONIA in the last 168 hours. Coagulation Profile: No results for input(s): INR, PROTIME in the last 168 hours. Cardiac Enzymes: No results for input(s): CKTOTAL, CKMB, CKMBINDEX, TROPONINI in the last 168 hours. BNP (last 3 results) No results for input(s): PROBNP in the last 8760 hours. HbA1C: No results for input(s): HGBA1C in the last 72 hours. CBG: No results for input(s): GLUCAP in the last 168 hours. Lipid Profile: No results for input(s): CHOL, HDL, LDLCALC, TRIG, CHOLHDL, LDLDIRECT in the last 72 hours. Thyroid Function Tests: No results for input(s): TSH, T4TOTAL, FREET4, T3FREE, THYROIDAB in the last 72 hours. Anemia Panel: No results for input(s): VITAMINB12, FOLATE, FERRITIN, TIBC, IRON, RETICCTPCT in the last 72 hours. Urine analysis:    Component Value Date/Time   COLORURINE YELLOW 11/12/2013 0533   APPEARANCEUR CLEAR 11/12/2013 0533   LABSPEC 1.023 11/12/2013 0533   PHURINE 6.0 11/12/2013 0533   GLUCOSEU NEGATIVE 11/12/2013 0533   HGBUR NEGATIVE 11/12/2013 0533   BILIRUBINUR NEGATIVE 11/12/2013 0533   KETONESUR NEGATIVE 11/12/2013 0533   PROTEINUR NEGATIVE 11/12/2013 0533   UROBILINOGEN 1.0 11/12/2013 0533   NITRITE NEGATIVE 11/12/2013 0533   LEUKOCYTESUR NEGATIVE 11/12/2013 0533    Sepsis Labs:  Lactic acid 1.7.  Personally Reviewed EKG:  None obtained this admission.  Assessment/Plan Right knee pain and swelling: concern for cellulitis and possibly septic  arthritis although synovial fluid study does not correlate with the later.  He was tachycardic with significant leukocytosis.  Patient has history of IVDU. No known trauma or injury but on his knees at work.  -Continue IV vancomycin -Follow cultures, ESR and CRP. -Pain management with PRN Tylenol, oxycodone and Dilaudid. -[facility] consulted  IVDU: reports injecting heroin. -Counseled -COWS -Check UDS  Tobacco use disorder: reports smoking about half a pack a day.  -Encouraged to quit. -Declined nicotine patch.  DVT prophylaxis: SCD  Code Status: Full code Family Communication: Patient update family let me know if any question.  Disposition Plan: Admit to MedSurg Consults called: Orthopedic surgery Admission status: Observation status   Mercy Riding MD [facility]  If 7PM-7AM, please contact night-coverage [email or web address] Password [email or web address]  04/27/2019, 8:15 AM

## 2019-04-28 DIAGNOSIS — M25461 Effusion, right knee: Principal | ICD-10-CM

## 2019-04-28 DIAGNOSIS — M25561 Pain in right knee: Secondary | ICD-10-CM

## 2019-04-28 LAB — NOVEL CORONAVIRUS, NAA (HOSP ORDER, SEND-OUT TO REF LAB; TAT 18-24 HRS): SARS-CoV-2, NAA: NOT DETECTED

## 2019-04-28 LAB — HIV ANTIBODY (ROUTINE TESTING W REFLEX): HIV Screen 4th Generation wRfx: NONREACTIVE

## 2019-04-28 LAB — GLUCOSE, BODY FLUID OTHER: Glucose, Body Fluid Other: 96 mg/dL

## 2019-04-28 NOTE — Plan of Care (Signed)
Patient lying in bed this morning; states pain is continuous in right knee. No needs expressed at this time. Will continue to monitor.

## 2019-04-28 NOTE — Progress Notes (Signed)
[name].  PROGRESS NOTE    James Crane  ZOX:[id number] DOB: [birth year] DOA: 04/27/2019 PCP: Default, Provider, MD   Brief Narrative:   James Crane is a 28 y.o. male with history of IVDU and tobacco use disorder presenting with right knee pain.  Patient reports spontaneous onset of right knee pain and swelling about 1 week ago that has acutely gotten worse over the last 24 hours and prompted him to come to ED.  He also noted redness over his right knee.  Denies history of trauma or injury.  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  Describes the pain as sharp.  Pain is on and off.  Rates his pain severity 10/10 when he arrived.  Currently pain is 9/10.  Pain is worse with movement, bending knee or bearing weight.  Alleviated by pain medication.  He denies fever, chills, URI symptoms, chest pain, dyspnea, nausea, vomiting, abdominal pain or UTI symptoms.  He denies numbness or tingling in his lower extremities   Assessment & Plan:   Active Problems:   Pain and swelling of knee, right   Right knee pain and swelling     - concern for cellulitis and possibly septic arthritis although synovial fluid study does not correlate with the later.       - He was tachycardic with significant leukocytosis.       - Patient has history of IVDU. No known trauma or injury but on his knees at work.      - Continue IV vancomycin     - Synovial fluid Cx/Bld Cx: NTD     - Pain management with PRN Tylenol, oxycodone and Dilaudid.     - [facility] consulted  IVDU:      - reports injecting heroin.     - Counseled     - COWS     - Check UDS: positive for amphetamine, opiods  Tobacco use disorder:      - reports smoking about half a pack a day.      - Encouraged to quit.     - Declined nicotine patch.  DVT prophylaxis: SCDs Code Status: FULL   Disposition Plan: TBD  Consultants:   Ortho  Antimicrobials:  [name]. Vanc   ROS:  Denies CP, dyspnea, N, V. Reports knee pain.  Remainder 10-pt ROS is negative for all not previously mentioned.  Subjective: "I'll let them draw blood next time."  Objective: Vitals:   04/27/19 1206 04/27/19 1413 04/27/19 2112 04/28/19 0602  BP: 134/78 123/76 94/69 132/79  Pulse: 88 87 95 86  Resp: 20 18 18 16   Temp:  99.6 F (37.6 C) 99.6 F (37.6 C) 99.4 F (37.4 C)  TempSrc:  Oral Oral Oral  SpO2: 100% 100% 100% 100%  Weight:      Height:        Intake/Output Summary (Last 24 hours) at 04/28/2019 1701 Last data filed at 04/28/2019 0605 Gross per 24 hour  Intake 500 ml  Output 700 ml  Net -200 ml   Filed Weights   04/27/19 0056  Weight: 79.4 kg    Examination:  General: 28 y.o. male walking around room in NAD Eyes: PERRL, normal sclera ENMT: Nares patent w/o discharge, orophaynx clear, dentition normal, ears w/o discharge/lesions/ulcers Cardiovascular: RRR, +S1, S2, no m/g/r, equal pulses throughout Respiratory: CTABL, no w/r/r, normal WOB GI: BS+, NDNT, no masses noted, no organomegaly noted MSK: No c/c; r knee bandaging CDI Skin: No rashes,  bruises, ulcerations noted Neuro: A&O x 3, no focal deficits Psyc: Appropriate interaction and affect, calm/cooperative   Data Reviewed: I have personally reviewed following labs and imaging studies.  CBC: Recent Labs  Lab 04/27/19 0418  WBC 21.5*  NEUTROABS 14.8*  HGB 13.2  HCT 39.6  MCV 87.0  PLT 500*   Basic Metabolic Panel: Recent Labs  Lab 04/27/19 0418  NA 136  K 3.6  CL 101  CO2 22  GLUCOSE 103*  BUN 16  CREATININE 0.81  CALCIUM 9.2   GFR: Estimated Creatinine Clearance: 153.8 mL/min (by C-G formula based on SCr of 0.81 mg/dL). Liver Function Tests: Recent Labs  Lab 04/27/19 0418  AST 25  ALT 28  ALKPHOS 50  BILITOT 0.7  PROT 8.1  ALBUMIN 4.0   No results for input(s): LIPASE, AMYLASE in the last 168 hours. No results for input(s): AMMONIA in the last 168 hours. Coagulation Profile: No results for input(s): INR, PROTIME in the  last 168 hours. Cardiac Enzymes: No results for input(s): CKTOTAL, CKMB, CKMBINDEX, TROPONINI in the last 168 hours. BNP (last 3 results) No results for input(s): PROBNP in the last 8760 hours. HbA1C: No results for input(s): HGBA1C in the last 72 hours. CBG: No results for input(s): GLUCAP in the last 168 hours. Lipid Profile: No results for input(s): CHOL, HDL, LDLCALC, TRIG, CHOLHDL, LDLDIRECT in the last 72 hours. Thyroid Function Tests: No results for input(s): TSH, T4TOTAL, FREET4, T3FREE, THYROIDAB in the last 72 hours. Anemia Panel: No results for input(s): VITAMINB12, FOLATE, FERRITIN, TIBC, IRON, RETICCTPCT in the last 72 hours. Sepsis Labs: Recent Labs  Lab 04/27/19 0418 04/27/19 0839  LATICACIDVEN 1.7 0.6    Recent Results (from the past 240 hour(s))  Culture, blood (routine x 2)     Status: None (Preliminary result)   Collection Time: 04/27/19  3:55 AM   Specimen: BLOOD  Result Value Ref Range Status   Specimen Description   Final    BLOOD RIGHT ANTECUBITAL Performed at [facility], [address], [place], [place] [address]    Special Requests   Final    BOTTLES DRAWN AEROBIC AND ANAEROBIC Blood Culture adequate volume Performed at [facility], [address], [place], [place] [address]    Culture   Final    NO GROWTH 1 DAY Performed at [facility], [address], [place], [place] [address]    Report Status PENDING  Incomplete  Culture, blood (routine x 2)     Status: None (Preliminary result)   Collection Time: 04/27/19  4:18 AM   Specimen: BLOOD LEFT FOREARM  Result Value Ref Range Status   Specimen Description   Final    BLOOD LEFT FOREARM Performed at [facility], [address] [place], [place] [address]    Special Requests   Final    BOTTLES DRAWN AEROBIC AND ANAEROBIC Blood Culture adequate volume Performed at [facility], [address] [place],  [place] [address]    Culture   Final    NO GROWTH 1 DAY Performed at [facility], [address] [place], [place] [address]    Report Status PENDING  Incomplete  Body fluid culture     Status: None (Preliminary result)   Collection Time: 04/27/19  4:18 AM   Specimen: Synovium; Body Fluid  Result Value Ref Range Status   Specimen Description   Final    SYNOVIAL KNEE RIGHT Performed at [facility], [place] [address], [place], [place] [address]    Special Requests   Final    Normal Performed at [place] [address] [place], [place] [address]    Gram Stain   Final    FEW WBC PRESENT,BOTH PMN AND MONONUCLEAR NO ORGANISMS SEEN    Culture   Final    NO GROWTH 1 DAY Performed at [facility], [place] [address] [place], [place] [address]    Report Status PENDING  Incomplete  Novel Coronavirus, NAA (hospital order; send-out to ref lab)     Status: None   Collection Time: 04/27/19 12:26 PM   Specimen: Nasopharyngeal Swab; Respiratory  Result Value Ref Range Status   SARS-CoV-2, NAA NOT DETECTED NOT DETECTED Final    Comment: (NOTE) This nucleic acid amplification test was developed and its performance characteristics determined by [employer]. Nucleic acid amplification tests include PCR and TMA. This test has not been FDA cleared or approved. This test has been authorized by FDA under an Emergency Use Authorization (EUA). This test is only authorized for the duration of time the declaration that circumstances exist justifying the authorization of the emergency use of in vitro diagnostic tests for detection of SARS-CoV-2 virus and/or diagnosis of COVID-19 infection under section 564(b)(1) of the Act, 21 U.S.C. 485IOE-7(O) (1), unless the authorization is terminated or revoked sooner. When diagnostic testing is negative, the possibility of a false negative result should be considered in the context of a patient's recent exposures and the  presence of clinical signs and symptoms consistent with COVID-19. An individual without symptoms of COVID- 19 and who is not shedding SARS-CoV-2 vi rus would expect to have a negative (not detected) result in this assay. Performed At: [facility] [place], [place] [id number] Rush Farmer MD EX:[id number]    [place]  Final    Comment: Performed at [place] [address], [place], [place] [address]      Radiology Studies: No results found.   Scheduled Meds: . senna  1 tablet Oral BID   Continuous Infusions: . vancomycin 1,250 mg (04/28/19 1355)     LOS: 0 days    Time spent: 25 minutes spent in the coordination of care today.    Jonnie Finner, DO [facility] Pager [phone]  If 7PM-7AM, please contact night-coverage [email or web address] Password [email or web address] 04/28/2019, 5:01 PM

## 2019-04-29 LAB — RENAL FUNCTION PANEL
Albumin: 2.9 g/dL — ABNORMAL LOW (ref 3.5–5.0)
Anion gap: 9 (ref 5–15)
BUN: 10 mg/dL (ref 6–20)
CO2: 21 mmol/L — ABNORMAL LOW (ref 22–32)
Calcium: 8.8 mg/dL — ABNORMAL LOW (ref 8.9–10.3)
Chloride: 108 mmol/L (ref 98–111)
Creatinine, Ser: 0.69 mg/dL (ref 0.61–1.24)
GFR calc Af Amer: >60 mL/min (ref >60)
GFR calc non Af Amer: >60 mL/min (ref >60)
Glucose, Bld: 97 mg/dL (ref 70–99)
Phosphorus: 3.4 mg/dL (ref 2.5–4.6)
Potassium: 4.1 mmol/L (ref 3.5–5.1)
Sodium: 138 mmol/L (ref 135–145)

## 2019-04-29 LAB — CBC WITH DIFFERENTIAL/PLATELET
Abs Immature Granulocytes: 0.07 10*3/uL (ref 0.00–0.07)
Basophils Absolute: 0.1 10*3/uL (ref 0.0–0.1)
Basophils Relative: 1 %
Eosinophils Absolute: 0.3 10*3/uL (ref 0.0–0.5)
Eosinophils Relative: 2 %
HCT: 36.7 % — ABNORMAL LOW (ref 39.0–52.0)
Hemoglobin: 12.2 g/dL — ABNORMAL LOW (ref 13.0–17.0)
Immature Granulocytes: 0 %
Lymphocytes Relative: 16 %
Lymphs Abs: 2.7 10*3/uL (ref 0.7–4.0)
MCH: 29.3 pg (ref 26.0–34.0)
MCHC: 33.2 g/dL (ref 30.0–36.0)
MCV: 88 fL (ref 80.0–100.0)
Monocytes Absolute: 1.2 10*3/uL — ABNORMAL HIGH (ref 0.1–1.0)
Monocytes Relative: 7 %
Neutro Abs: 13 10*3/uL — ABNORMAL HIGH (ref 1.7–7.7)
Neutrophils Relative %: 74 %
Platelets: 404 10*3/uL — ABNORMAL HIGH (ref 150–400)
RBC: 4.17 MIL/uL — ABNORMAL LOW (ref 4.22–5.81)
RDW: 13.5 % (ref 11.5–15.5)
WBC: 17.3 10*3/uL — ABNORMAL HIGH (ref 4.0–10.5)
nRBC: 0 % (ref 0.0–0.2)

## 2019-04-29 LAB — MAGNESIUM: Magnesium: 2 mg/dL (ref 1.7–2.4)

## 2019-04-29 LAB — C-REACTIVE PROTEIN: CRP: 6.4 mg/dL — ABNORMAL HIGH (ref <1.0)

## 2019-04-29 MED ORDER — SODIUM CHLORIDE 0.9% FLUSH: 10.0000 mL | INTRAVENOUS | Status: DC | PRN

## 2019-04-29 MED ORDER — SODIUM CHLORIDE 0.9% FLUSH
10.0000 mL | Freq: Two times a day (BID) | INTRAVENOUS | Status: DC
  Administered 2019-04-29: 10 mL

## 2019-04-29 NOTE — Progress Notes (Signed)
Pt friend at bedside.  One of them asked on phone what are you driving.  Friend left floor and then came back to room.  This nurse went into room and it appeared that both were going into bathroom.  Pt stated he was just up to go to bathroom.

## 2019-04-29 NOTE — Progress Notes (Signed)
[name].  PROGRESS NOTE    James Crane  ZHY:[id number] DOB: [birth year] DOA: 04/27/2019 PCP: Default, Provider, MD   Brief Narrative:   James DyNicholas G Hortonis a 28 y.o.malewith history ofIVDU and tobacco use disorder presenting with right knee pain.  Patient reports spontaneous onset of right knee pain and swelling about 1 week ago that has acutely gotten worse over the last 24 hours and prompted him to come to ED. He also noted redness over his right knee. Denies history of trauma or injury. 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 Pain is on and off. Rates his pain severity 10/10 when he arrived.Currently pain is 9/10. Pain is worse with movement, bending knee or bearing weight. Alleviated by pain medication.  He denies fever, chills, URI symptoms, chest pain, dyspnea, nausea, vomiting, abdominal pain or UTI symptoms. He denies numbness or tingling in his lower extremities   Assessment & Plan:   Active Problems:   Pain and swelling of knee, right   Right knee pain and swelling     - concern for cellulitis and possibly septic arthritis although synovial fluid study does not correlate with the later.       - He was tachycardic with significant leukocytosis.       - Patient has history of IVDU. No known trauma or injury but on his knees at work.      - Continue IV vancomycin     - Synovial fluid Cx/Bld Cx: NTD     - Pain management with PRN Tylenol, oxycodone and Dilaudid.     - [facility] consulted     - transition to clinda in AM  IVDU:      - reports injecting heroin.     - Counseled     - COWS     - Check UDS: positive for amphetamine, opiods  Tobacco use disorder:      - reports smoking about half a pack a day.      - Encouraged to quit.     - Declined nicotine patch.  DVT prophylaxis: SCDs Code Status: FULL   Disposition Plan: TBD  Consultants:   Ortho  Antimicrobials:   Vanc   ROS:  Denies  CP, dyspnea, N, V. Reports knee pain. Reports swelling improving. Remainder 10-pt ROS is negative for all not previously mentioned.  Subjective: "When do you think I'll go?"  Objective: Vitals:   04/28/19 0602 04/28/19 2359 04/29/19 0456 04/29/19 1348  BP: 132/79 132/66 130/77 131/66  Pulse: 86 72 71 89  Resp: 16 14 14 17   Temp: 99.4 F (37.4 C) 98.6 F (37 C) 98.4 F (36.9 C) 98.4 F (36.9 C)  TempSrc: Oral Oral Oral Oral  SpO2: 100% 96% 100% 97%  Weight:      Height:        Intake/Output Summary (Last 24 hours) at 04/29/2019 1427 Last data filed at 04/29/2019 1031 Gross per 24 hour  Intake 1948.68 ml  Output 1800 ml  Net 148.68 ml   Filed Weights   04/27/19 0056  Weight: 79.4 kg    Examination:  General: 28 y.o. male walking around room in NAD Eyes: PERRL, normal sclera ENMT: Nares patent w/o discharge, orophaynx clear, dentition normal, ears w/o discharge/lesions/ulcers Cardiovascular: RRR, +S1, S2, no m/g/r, equal pulses throughout Respiratory: CTABL, no w/r/r, normal WOB GI: BS+, NDNT, no masses noted, no organomegaly noted MSK: No c/c; r knee bandaging CDI Skin: No rashes, bruises,  ulcerations noted Neuro: A&O x 3, no focal deficits Psyc: Appropriate interaction and affect, calm/cooperative   Data Reviewed: I have personally reviewed following labs and imaging studies.  CBC: Recent Labs  Lab 04/27/19 0418 04/29/19 0808  WBC 21.5* 17.3*  NEUTROABS 14.8* 13.0*  HGB 13.2 12.2*  HCT 39.6 36.7*  MCV 87.0 88.0  PLT 500* 785*   Basic Metabolic Panel: Recent Labs  Lab 04/27/19 0418 04/29/19 0808  NA 136 138  K 3.6 4.1  CL 101 108  CO2 22 21*  GLUCOSE 103* 97  BUN 16 10  CREATININE 0.81 0.69  CALCIUM 9.2 8.8*  MG  --  2.0  PHOS  --  3.4   GFR: Estimated Creatinine Clearance: 155.8 mL/min (by C-G formula based on SCr of 0.69 mg/dL). Liver Function Tests: Recent Labs  Lab 04/27/19 0418 04/29/19 0808  AST 25  --   ALT 28  --   ALKPHOS 50   --   BILITOT 0.7  --   PROT 8.1  --   ALBUMIN 4.0 2.9*   No results for input(s): LIPASE, AMYLASE in the last 168 hours. No results for input(s): AMMONIA in the last 168 hours. Coagulation Profile: No results for input(s): INR, PROTIME in the last 168 hours. Cardiac Enzymes: No results for input(s): CKTOTAL, CKMB, CKMBINDEX, TROPONINI in the last 168 hours. BNP (last 3 results) No results for input(s): PROBNP in the last 8760 hours. HbA1C: No results for input(s): HGBA1C in the last 72 hours. CBG: No results for input(s): GLUCAP in the last 168 hours. Lipid Profile: No results for input(s): CHOL, HDL, LDLCALC, TRIG, CHOLHDL, LDLDIRECT in the last 72 hours. Thyroid Function Tests: No results for input(s): TSH, T4TOTAL, FREET4, T3FREE, THYROIDAB in the last 72 hours. Anemia Panel: No results for input(s): VITAMINB12, FOLATE, FERRITIN, TIBC, IRON, RETICCTPCT in the last 72 hours. Sepsis Labs: Recent Labs  Lab 04/27/19 0418 04/27/19 0839  LATICACIDVEN 1.7 0.6    Recent Results (from the past 240 hour(s))  Culture, blood (routine x 2)     Status: None (Preliminary result)   Collection Time: 04/27/19  3:55 AM   Specimen: BLOOD  Result Value Ref Range Status   Specimen Description   Final    BLOOD RIGHT ANTECUBITAL Performed at [facility] [address] [place], [place] [address]    Special Requests   Final    BOTTLES DRAWN AEROBIC AND ANAEROBIC Blood Culture adequate volume Performed at [facility] [address] [place], [place] [address]    Culture   Final    NO GROWTH 2 DAYS Performed at [facility] [address], [place], [place] [address]    Report Status PENDING  Incomplete  Culture, blood (routine x 2)     Status: None (Preliminary result)   Collection Time: 04/27/19  4:18 AM   Specimen: BLOOD LEFT FOREARM  Result Value Ref Range Status   Specimen Description   Final    BLOOD LEFT FOREARM Performed at [facility] [address], [place], [place] [address]    Special Requests   Final    BOTTLES DRAWN AEROBIC AND ANAEROBIC Blood Culture adequate volume Performed at [facility] [address] [place], [place] [address]    Culture   Final    NO GROWTH 2 DAYS Performed at [facility] [address] [place], [place] [address]    Report Status PENDING  Incomplete  Body fluid culture     Status: None (Preliminary result)   Collection Time: 04/27/19  4:18 AM   Specimen: Synovium; Body Fluid  Result Value Ref Range Status   Specimen Description   Final    SYNOVIAL KNEE RIGHT Performed at [facility], [address], [place], [place] [address]    Special Requests   Final    Normal Performed at [facility], [address], [place], [place] [address]    Gram Stain   Final    FEW WBC PRESENT,BOTH PMN AND MONONUCLEAR NO ORGANISMS SEEN    Culture   Final    NO GROWTH 2 DAYS Performed at [facility], [address] [place], [place] [address]    Report Status PENDING  Incomplete  Novel Coronavirus, NAA (hospital order; send-out to ref lab)     Status: None   Collection Time: 04/27/19 12:26 PM   Specimen: Nasopharyngeal Swab; Respiratory  Result Value Ref Range Status   SARS-CoV-2, NAA NOT DETECTED NOT DETECTED Final    Comment: (NOTE) This nucleic acid amplification test was developed and its performance characteristics determined by [employer]. Nucleic acid amplification tests include PCR and TMA. This test has not been FDA cleared or approved. This test has been authorized by FDA under an Emergency Use Authorization (EUA). This test is only authorized for the duration of time the declaration that circumstances exist justifying the authorization of the emergency use of in vitro diagnostic tests for detection of SARS-CoV-2 virus and/or diagnosis of COVID-19 infection under section 564(b)(1) of the  Act, 21 U.S.C. 696VEL-3(Y) (1), unless the authorization is terminated or revoked sooner. When diagnostic testing is negative, the possibility of a false negative result should be considered in the context of a patient's recent exposures and the presence of clinical signs and symptoms consistent with COVID-19. An individual without symptoms of COVID- 19 and who is not shedding SARS-CoV-2 vi rus would expect to have a negative (not detected) result in this assay. Performed At: [facility] [address] [place], [place] [id number] Jolene Schimke MD EN:[id number]    Coronavirus Source NASOPHARYNGEAL  Final    Comment: Performed at [facility], [address] [place], [place] [address]      Radiology Studies: No results found.   Scheduled Meds: . senna  1 tablet Oral BID  . sodium chloride flush  10-40 mL Intracatheter Q12H   Continuous Infusions: . vancomycin 1,250 mg (04/29/19 1423)     LOS: 1 day    Time spent: 25 minutes spent in the coordination of care today.    Teddy Spike, DO [facility] Pager [phone]  If 7PM-7AM, please contact night-coverage [email or web address] Password [email or web address] 04/29/2019, 2:27 PM

## 2019-04-30 ENCOUNTER — Inpatient Hospital Stay (HOSPITAL_COMMUNITY): Payer: Self-pay

## 2019-04-30 LAB — BODY FLUID CULTURE
Culture: NO GROWTH
Special Requests: NORMAL

## 2019-04-30 MED ORDER — CLINDAMYCIN HCL 300 MG PO CAPS: 300.0000 mg | ORAL_CAPSULE | Freq: Four times a day (QID) | ORAL | Status: DC

## 2019-04-30 MED ORDER — SODIUM CHLORIDE 0.9 % IV SOLN
2.0000 g | INTRAVENOUS | Status: DC
  Filled 2019-04-30: qty 20

## 2019-04-30 NOTE — Progress Notes (Signed)
Midline IV infiltrated, Removed by IV RN. Patient refused to let IV RN start another IV at this time. Primary RN notified.

## 2019-04-30 NOTE — Progress Notes (Signed)
James Crane refused to have labs drawn this am. "No, Im done letting them poke me, you're not going to be able to get it". Will notify provider.

## 2019-04-30 NOTE — Progress Notes (Signed)
rm 1540, James Crane 28yoM, had a midline placed 9/4. It infiltrated. IV was d/c by IV Nurse. heat pack applied. Very poor veins.

## 2019-04-30 NOTE — Progress Notes (Signed)
[name].  PROGRESS NOTE    James Crane  ZOX:[id number] DOB: 1990/10/31 DOA: 04/27/2019 PCP: Default, Provider, MD   Brief Narrative:   James DyNicholas G Hortonis a 28 y.o.malewith history ofIVDU and tobacco use disorder presenting with right knee pain.  Patient reports spontaneous onset of right knee pain and swelling about 1 week ago that has acutely gotten worse over the last 24 hours and prompted him to come to ED. He also noted redness over his right knee. Denies history of trauma or injury. 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 Pain is on and off. Rates his pain severity 10/10 when he arrived.Currently pain is 9/10. Pain is worse with movement, bending knee or bearing weight. Alleviated by pain medication.  He denies fever, chills, URI symptoms, chest pain, dyspnea, nausea, vomiting, abdominal pain or UTI symptoms. He denies numbness or tingling in his lower extremities   Assessment & Plan:   Active Problems:   Pain and swelling of knee, right   Right knee pain and swelling - concern for cellulitis and possibly septic arthritis although synovial fluid study does not correlate with the later.  - He was tachycardic with significant leukocytosis.  - Patient has history of IVDU. No known trauma or injury but on his knees at work.  - Continue IV vancomycin - Synovial fluid Cx/Bld Cx: NTD - Pain management with PRN Tylenol, oxycodone and Dilaudid. 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 - Counseled - COWS - Check UDS: positive for amphetamine, opiods  Tobacco use disorder:  - reports smoking about  half a pack a day.  - Encouraged to quit. - Declined nicotine patch.  DVT prophylaxis:SCDs Code Status:FULL Disposition Plan:TBD  Consultants:  Ortho  Antimicrobials: . clinda PO 300mg  q6h   ROS:  C/o knee pain. Denies palpitations, CP, dyspnea. Remainder 10-pt ROS is negative for all not previously mentioned.  Subjective: "If I can hit my veins, why can't they?"  Objective: Vitals:   04/29/19 0456 04/29/19 1348 04/29/19 2140 04/30/19 0558  BP: 130/77 131/66 (!) 109/52 130/78  Pulse: 71 89 79 67  Resp: 14 17 16 18   Temp: 98.4 F (36.9 C) 98.4 F (36.9 C) 99.1 F (37.3 C) 98 F (36.7 C)  TempSrc: Oral Oral Oral Oral  SpO2: 100% 97% 100% 100%  Weight:      Height:        Intake/Output Summary (Last 24 hours) at 04/30/2019 0940 Last data filed at 04/30/2019 [id number] Gross per 24 hour  Intake 1101.87 ml  Output 615 ml  Net 486.87 ml   Filed Weights   04/27/19 0056  Weight: 79.4 kg    Examination:  General:28 y.o.malewalking around roomin NAD Eyes: PERRL, normal sclera ENMT: Nares patent w/o discharge, orophaynx clear, dentition normal, ears w/o discharge/lesions/ulcers Cardiovascular: RRR, +S1, S2, no m/g/r, equal pulses throughout Respiratory: CTABL, no w/r/r, normal WOB GI: BS+, NDNT, no masses noted, no organomegaly noted MSK: Noc/c; r knee erythema/sweeling Skin: No rashes, bruises, ulcerations noted Neuro: A&O x 3, no focal deficits Psyc: Appropriate interaction and affect, calm/cooperative   Data Reviewed: I have personally reviewed following  labs and imaging studies.  CBC: Recent Labs  Lab 04/27/19 0418 04/29/19 0808  WBC 21.5* 17.3*  NEUTROABS 14.8* 13.0*  HGB 13.2 12.2*  HCT 39.6 36.7*  MCV 87.0 88.0  PLT 500* 010*   Basic Metabolic Panel: Recent Labs  Lab 04/27/19 0418 04/29/19 0808  NA 136 138  K 3.6 4.1  CL 101 108  CO2 22 21*  GLUCOSE 103* 97  BUN 16 10  CREATININE 0.81 0.69  CALCIUM 9.2 8.8*  MG  --  2.0   PHOS  --  3.4   GFR: Estimated Creatinine Clearance: 155.8 mL/min (by C-G formula based on SCr of 0.69 mg/dL). Liver Function Tests: Recent Labs  Lab 04/27/19 0418 04/29/19 0808  AST 25  --   ALT 28  --   ALKPHOS 50  --   BILITOT 0.7  --   PROT 8.1  --   ALBUMIN 4.0 2.9*   No results for input(s): LIPASE, AMYLASE in the last 168 hours. No results for input(s): AMMONIA in the last 168 hours. Coagulation Profile: No results for input(s): INR, PROTIME in the last 168 hours. Cardiac Enzymes: No results for input(s): CKTOTAL, CKMB, CKMBINDEX, TROPONINI in the last 168 hours. BNP (last 3 results) No results for input(s): PROBNP in the last 8760 hours. HbA1C: No results for input(s): HGBA1C in the last 72 hours. CBG: No results for input(s): GLUCAP in the last 168 hours. Lipid Profile: No results for input(s): CHOL, HDL, LDLCALC, TRIG, CHOLHDL, LDLDIRECT in the last 72 hours. Thyroid Function Tests: No results for input(s): TSH, T4TOTAL, FREET4, T3FREE, THYROIDAB in the last 72 hours. Anemia Panel: No results for input(s): VITAMINB12, FOLATE, FERRITIN, TIBC, IRON, RETICCTPCT in the last 72 hours. Sepsis Labs: Recent Labs  Lab 04/27/19 0418 04/27/19 0839  LATICACIDVEN 1.7 0.6    Recent Results (from the past 240 hour(s))  Culture, blood (routine x 2)     Status: None (Preliminary result)   Collection Time: 04/27/19  3:55 AM   Specimen: BLOOD  Result Value Ref Range Status   Specimen Description   Final    BLOOD RIGHT ANTECUBITAL Performed at [facility] [address], [place], [place] [address]    Special Requests   Final    BOTTLES DRAWN AEROBIC AND ANAEROBIC Blood Culture adequate volume Performed at [facility] [address] [place], [place] [address]    Culture   Final    NO GROWTH 2 DAYS Performed at [facility] [address] [place] [address] [place], [place] [address]    Report Status PENDING  Incomplete  Culture, blood  (routine x 2)     Status: None (Preliminary result)   Collection Time: 04/27/19  4:18 AM   Specimen: BLOOD LEFT FOREARM  Result Value Ref Range Status   Specimen Description   Final    BLOOD LEFT FOREARM Performed at [facility] [address] [place], [place] [address]    Special Requests   Final    BOTTLES DRAWN AEROBIC AND ANAEROBIC Blood Culture adequate volume Performed at [facility] [address] [place], [place] [address]    Culture   Final    NO GROWTH 2 DAYS Performed at [facility] [address], [place], [place] [address]    Report Status PENDING  Incomplete  Body fluid culture     Status: None (Preliminary result)   Collection Time: 04/27/19  4:18 AM   Specimen: Synovium; Body  Fluid  Result Value Ref Range Status   Specimen Description   Final    SYNOVIAL KNEE RIGHT Performed at [facility], [address], [place], [place] [address]    Special Requests   Final    Normal Performed at [facility], [address] [place], [place] [address]    Gram Stain   Final    FEW WBC PRESENT,BOTH PMN AND MONONUCLEAR NO ORGANISMS SEEN    Culture   Final    NO GROWTH 2 DAYS Performed at [facility], [address] [place], [place] [address]    Report Status PENDING  Incomplete  Novel Coronavirus, NAA (hospital order; send-out to ref lab)     Status: None   Collection Time: 04/27/19 12:26 PM   Specimen: Nasopharyngeal Swab; Respiratory  Result Value Ref Range Status   SARS-CoV-2, NAA NOT DETECTED NOT DETECTED Final    Comment: (NOTE) This nucleic acid amplification test was developed and its performance characteristics determined by [employer]. Nucleic acid amplification tests include PCR and TMA. This test has not been FDA cleared or approved. This test has been authorized by FDA under an Emergency Use Authorization (EUA). This test is only authorized for the duration of  time the declaration that circumstances exist justifying the authorization of the emergency use of in vitro diagnostic tests for detection of SARS-CoV-2 virus and/or diagnosis of COVID-19 infection under section 564(b)(1) of the Act, 21 U.S.C. 765YYT-0(P) (1), unless the authorization is terminated or revoked sooner. When diagnostic testing is negative, the possibility of a false negative result should be considered in the context of a patient's recent exposures and the presence of clinical signs and symptoms consistent with COVID-19. An individual without symptoms of COVID- 19 and who is not shedding SARS-CoV-2 vi rus would expect to have a negative (not detected) result in this assay. Performed At: [facility] [address] [place], [place] [id number] Jolene Schimke MD NT:[id number]    Coronavirus Source NASOPHARYNGEAL  Final    Comment: Performed at [facility], [address] [place], [place] [address]      Radiology Studies: No results found.   Scheduled Meds: . senna  1 tablet Oral BID  . sodium chloride flush  10-40 mL Intracatheter Q12H   Continuous Infusions: . cefTRIAXone (ROCEPHIN)  IV    . vancomycin Stopped (04/30/19 0700)     LOS: 2 days    Time spent: 25 minutes spent in the coordination of care today.    Teddy Spike, DO [facility] Pager [phone]  If 7PM-7AM, please contact night-coverage [email or web address] Password [email or web address] 04/30/2019, 9:40 AM

## 2019-04-30 NOTE — Progress Notes (Signed)
Pt's friend came to unit, and [facility] left AMA. RN observed pt walking to elevator with male friend, when asking was he leaving he said "Yes, im going to another hospital" RN notified Brenin he needs to sign AMA paperwork, he refused to sign and continued to get on elevator. Provider notified via message. AMA paperwork completed by Probation officer.

## 2019-04-30 NOTE — Discharge Summary (Signed)
Notified by nursing that patient left AMA.  Per nursing: Pt's friend came to unit, and [facility] left AMA. RN observed pt walking to elevator with male friend, when asking was he leaving he said "Yes, im going to another hospital" RN notified Abimelec he needs to sign AMA paperwork, he refused to sign and continued to get on elevator. Provider notified via message. AMA paperwork completed by Probation officer.    Recommend he follow up with PCP/ortho for knee pain/cellulitis.  Discharge Dx (see today's PN for full details) Right knee pain and swelling IVDU:  Tobacco use disorder:   .Jonnie Finner, DO

## 2019-05-02 LAB — CULTURE, BLOOD (ROUTINE X 2)
Culture: NO GROWTH
Culture: NO GROWTH
Special Requests: ADEQUATE
Special Requests: ADEQUATE
